# Patient Record
Sex: Female | Born: 1960
Health system: Southern US, Community
[De-identification: ages and names within clinical notes are randomized; demographics above are authoritative.]

## PROBLEM LIST (undated history)

## (undated) DIAGNOSIS — I779 Disorder of arteries and arterioles, unspecified: Secondary | ICD-10-CM

## (undated) DIAGNOSIS — I10 Essential (primary) hypertension: Secondary | ICD-10-CM

## (undated) DIAGNOSIS — Z87442 Personal history of urinary calculi: Secondary | ICD-10-CM

## (undated) DIAGNOSIS — U071 COVID-19: Secondary | ICD-10-CM

## (undated) DIAGNOSIS — G43909 Migraine, unspecified, not intractable, without status migrainosus: Secondary | ICD-10-CM

## (undated) DIAGNOSIS — R87619 Unspecified abnormal cytological findings in specimens from cervix uteri: Secondary | ICD-10-CM

## (undated) DIAGNOSIS — E785 Hyperlipidemia, unspecified: Secondary | ICD-10-CM

## (undated) HISTORY — PX: GYNECOLOGIC CRYOSURGERY: SHX857

## (undated) HISTORY — DX: COVID-19: U07.1

## (undated) HISTORY — DX: Disorder of arteries and arterioles, unspecified: I77.9

## (undated) HISTORY — DX: Unspecified abnormal cytological findings in specimens from cervix uteri: R87.619

## (undated) HISTORY — DX: Essential (primary) hypertension: I10

## (undated) HISTORY — DX: Migraine, unspecified, not intractable, without status migrainosus: G43.909

## (undated) HISTORY — PX: COLPOSCOPY: SHX161

## (undated) HISTORY — DX: Hyperlipidemia, unspecified: E78.5

## (undated) HISTORY — PX: TUBAL LIGATION: SHX77

---

## 1998-10-22 ENCOUNTER — Ambulatory Visit (HOSPITAL_COMMUNITY): Admission: RE | Admit: 1998-10-22 | Discharge: 1998-10-22 | Payer: Self-pay | Admitting: Gynecology

## 2000-12-21 ENCOUNTER — Other Ambulatory Visit: Admission: RE | Admit: 2000-12-21 | Discharge: 2000-12-21 | Payer: Self-pay | Admitting: Gynecology

## 2001-03-26 ENCOUNTER — Emergency Department (HOSPITAL_COMMUNITY): Admission: EM | Admit: 2001-03-26 | Discharge: 2001-03-26 | Payer: Self-pay | Admitting: Emergency Medicine

## 2002-04-20 ENCOUNTER — Other Ambulatory Visit: Admission: RE | Admit: 2002-04-20 | Discharge: 2002-04-20 | Payer: Self-pay | Admitting: Gynecology

## 2004-04-16 ENCOUNTER — Ambulatory Visit (HOSPITAL_COMMUNITY): Admission: RE | Admit: 2004-04-16 | Discharge: 2004-04-16 | Payer: Self-pay | Admitting: Plastic Surgery

## 2004-04-16 ENCOUNTER — Ambulatory Visit (HOSPITAL_BASED_OUTPATIENT_CLINIC_OR_DEPARTMENT_OTHER): Admission: RE | Admit: 2004-04-16 | Discharge: 2004-04-16 | Payer: Self-pay | Admitting: Plastic Surgery

## 2004-04-17 ENCOUNTER — Encounter (INDEPENDENT_AMBULATORY_CARE_PROVIDER_SITE_OTHER): Payer: Self-pay | Admitting: Specialist

## 2006-02-09 ENCOUNTER — Emergency Department (HOSPITAL_COMMUNITY): Admission: EM | Admit: 2006-02-09 | Discharge: 2006-02-09 | Payer: Self-pay | Admitting: Family Medicine

## 2006-04-07 ENCOUNTER — Encounter: Payer: Self-pay | Admitting: Gynecology

## 2007-11-10 ENCOUNTER — Other Ambulatory Visit: Admission: RE | Admit: 2007-11-10 | Discharge: 2007-11-10 | Payer: Self-pay | Admitting: Gynecology

## 2009-04-23 ENCOUNTER — Ambulatory Visit (HOSPITAL_COMMUNITY): Admission: RE | Admit: 2009-04-23 | Discharge: 2009-04-23 | Payer: Self-pay | Admitting: Gynecology

## 2009-07-17 ENCOUNTER — Emergency Department (HOSPITAL_COMMUNITY): Admission: EM | Admit: 2009-07-17 | Discharge: 2009-07-17 | Payer: Self-pay | Admitting: Family Medicine

## 2011-04-24 NOTE — Op Note (Signed)
NAME:  Melanie Howard, Melanie Howard                        ACCOUNT NO.:  [id number]   MEDICAL RECORD NO.:  [id number]                   PATIENT TYPE:  AMB   LOCATION:  DSC                                  FACILITY:  [facility]   PHYSICIAN:  Etter Sjogren, M.D.                  DATE OF BIRTH:  11-10-1961   DATE OF PROCEDURE:  04/16/2004  DATE OF DISCHARGE:                                 OPERATIVE REPORT   PREOPERATIVE DIAGNOSES:  1. Lesion of undetermined behavior greater than 1.0 cm of forehead.  2. Lesion of undetermined behavior less than 0.5 cm of the back times four.   POSTOPERATIVE DIAGNOSES:  1. Lesion of undetermined behavior greater than 1.0 cm of forehead.  2. Lesion of undetermined behavior less than 0.5 cm of the back times four.  3. Complicated open wound of the scalp 2.0 cm.  4. Complicated open wounds of the trunk of 4.5 cm.   OPERATION PERFORMED:  1. Excision of lesion of undetermined behavior of the scalp greater than 1.0     cm.  2. Excision of lesions of the trunk on the back less than 0.5 cm times four.  3. Complex wound closure of scalp 2.0 cm.  4. Wound closures of the trunk 4.5 cm.   SURGEON:  Etter Sjogren, M.D.   ANESTHESIA:  1% Xylocaine with epinephrine plus bicarb.   INDICATIONS FOR PROCEDURE:  This 50 year old woman had a lesion of her  scalp, has actinic keratosis, is in the hair.  This warrants a wider  excision.  We have talked about using Efudex cream but this might cause more  area of alopecia and she prefers the excision.  She understands the nature  of the procedure and the risks and possibility of further surgery depending  on the final pathology report and wishes to proceed.  In addition, she has  several pigmented lesions with irregular borders that have enlarged and  changed  on her trunk on her back and wanted excision as well.  She wished  to proceed.   DESCRIPTION OF PROCEDURE:  The patient was placed first supine.  The  elliptical excision was  designed around this area of actinic keratosis of  her scalp where she also has pigmented lesion within this as well.  She was  prepped with Betadine, draped with sterile drapes and satisfactory local  anesthesia was achieved.  The excision was performed, the wound irrigated  thoroughly and excellent hemostasis was confirmed.  A layered closure with 3-  0 Monocryl with interrupted inverted deep sutures, advancing the skin edges  together, then 5-0 Prolene simple interrupted sutures.  Antibiotic ointment  applied.  Patient then placed prone, prepped with Betadine and draped with  sterile drapes.  Satisfactory local anesthesia was achieved and all these  four lesions were excised and removed as specimens.  The wounds irrigated  thoroughly and again layered closure with 4-0 Monocryl interrupted inverted  deep  sutures and 4-0 Monocryl running subcuticular suture with an occasional 5-0  Prolene simple interrupted suture as needed.  3-0 Monocryl interrupted deep  sutures were used on the wound over the left lower back.  The patient  tolerated the procedure well.  Steri-Strips applied.  We will see her  back  in the office in about 10 days for recheck.                                               Etter Sjogren, M.D.    DB/MEDQ  D:  04/16/2004  T:  04/17/2004  Job:  [id number]

## 2013-12-04 ENCOUNTER — Other Ambulatory Visit: Payer: Self-pay | Admitting: Gynecology

## 2013-12-04 DIAGNOSIS — Z1231 Encounter for screening mammogram for malignant neoplasm of breast: Secondary | ICD-10-CM

## 2013-12-08 ENCOUNTER — Ambulatory Visit: Payer: Self-pay | Admitting: Gynecology

## 2013-12-19 ENCOUNTER — Ambulatory Visit (HOSPITAL_COMMUNITY): Payer: 59

## 2013-12-22 ENCOUNTER — Ambulatory Visit (HOSPITAL_COMMUNITY)
Admission: RE | Admit: 2013-12-22 | Discharge: 2013-12-22 | Disposition: A | Discharge status: Home / Self Care | Payer: 59 | Source: Ambulatory Visit | Attending: Gynecology | Admitting: Gynecology

## 2013-12-22 DIAGNOSIS — Z1231 Encounter for screening mammogram for malignant neoplasm of breast: Secondary | ICD-10-CM | POA: Insufficient documentation

## 2014-01-04 ENCOUNTER — Ambulatory Visit: Payer: Self-pay | Admitting: Gynecology

## 2014-01-25 ENCOUNTER — Ambulatory Visit: Payer: Self-pay | Admitting: Gynecology

## 2014-02-05 ENCOUNTER — Encounter: Payer: Self-pay | Admitting: Gynecology

## 2014-02-05 ENCOUNTER — Other Ambulatory Visit (HOSPITAL_COMMUNITY)
Admission: RE | Admit: 2014-02-05 | Discharge: 2014-02-05 | Disposition: A | Discharge status: Home / Self Care | Payer: 59 | Source: Ambulatory Visit | Attending: Gynecology | Admitting: Gynecology

## 2014-02-05 ENCOUNTER — Ambulatory Visit (INDEPENDENT_AMBULATORY_CARE_PROVIDER_SITE_OTHER): Payer: 59 | Admitting: Gynecology

## 2014-02-05 VITALS — BP 116/70 | Ht 62.0 in | Wt 129.0 lb

## 2014-02-05 DIAGNOSIS — N318 Other neuromuscular dysfunction of bladder: Secondary | ICD-10-CM

## 2014-02-05 DIAGNOSIS — Z01419 Encounter for gynecological examination (general) (routine) without abnormal findings: Secondary | ICD-10-CM | POA: Insufficient documentation

## 2014-02-05 DIAGNOSIS — Z1151 Encounter for screening for human papillomavirus (HPV): Secondary | ICD-10-CM | POA: Insufficient documentation

## 2014-02-05 DIAGNOSIS — N3281 Overactive bladder: Secondary | ICD-10-CM

## 2014-02-05 DIAGNOSIS — N926 Irregular menstruation, unspecified: Secondary | ICD-10-CM

## 2014-02-05 LAB — FOLLICLE STIMULATING HORMONE: FSH: 79.4 m[IU]/mL

## 2014-02-05 LAB — URINALYSIS W MICROSCOPIC + REFLEX CULTURE
BACTERIA UA: NONE SEEN
Bilirubin Urine: NEGATIVE
Casts: NONE SEEN
Crystals: NONE SEEN
Glucose, UA: NEGATIVE mg/dL
KETONES UR: NEGATIVE mg/dL
Leukocytes, UA: NEGATIVE
NITRITE: NEGATIVE
Protein, ur: NEGATIVE mg/dL
Specific Gravity, Urine: 1.012 (ref 1.005–1.030)
Squamous Epithelial / LPF: NONE SEEN
UROBILINOGEN UA: 0.2 mg/dL (ref 0.0–1.0)
pH: 5 (ref 5.0–8.0)

## 2014-02-05 LAB — CBC WITH DIFFERENTIAL/PLATELET
Basophils Absolute: 0.1 10*3/uL (ref 0.0–0.1)
Basophils Relative: 1 % (ref 0–1)
Eosinophils Absolute: 0.2 10*3/uL (ref 0.0–0.7)
Eosinophils Relative: 3 % (ref 0–5)
HCT: 40.4 % (ref 36.0–46.0)
Hemoglobin: 14.3 g/dL (ref 12.0–15.0)
Lymphocytes Relative: 32 % (ref 12–46)
Lymphs Abs: 1.9 10*3/uL (ref 0.7–4.0)
MCH: 30.9 pg (ref 26.0–34.0)
MCHC: 35.4 g/dL (ref 30.0–36.0)
MCV: 87.3 fL (ref 78.0–100.0)
Monocytes Absolute: 0.5 10*3/uL (ref 0.1–1.0)
Monocytes Relative: 9 % (ref 3–12)
Neutro Abs: 3.2 10*3/uL (ref 1.7–7.7)
Neutrophils Relative %: 55 % (ref 43–77)
Platelets: 227 10*3/uL (ref 150–400)
RBC: 4.63 MIL/uL (ref 3.87–5.11)
RDW: 12.9 % (ref 11.5–15.5)
WBC: 5.9 10*3/uL (ref 4.0–10.5)

## 2014-02-05 LAB — COMPREHENSIVE METABOLIC PANEL
ALT: 20 U/L (ref 0–35)
AST: 19 U/L (ref 0–37)
Albumin: 4.6 g/dL (ref 3.5–5.2)
Alkaline Phosphatase: 73 U/L (ref 39–117)
BUN: 15 mg/dL (ref 6–23)
CO2: 27 mEq/L (ref 19–32)
Calcium: 9.3 mg/dL (ref 8.4–10.5)
Chloride: 103 mEq/L (ref 96–112)
Creat: 0.71 mg/dL (ref 0.50–1.10)
Glucose, Bld: 71 mg/dL (ref 70–99)
Potassium: 4.1 mEq/L (ref 3.5–5.3)
Sodium: 142 mEq/L (ref 135–145)
Total Bilirubin: 0.6 mg/dL (ref 0.2–1.2)
Total Protein: 7.4 g/dL (ref 6.0–8.3)

## 2014-02-05 LAB — LIPID PANEL
Cholesterol: 183 mg/dL (ref 0–200)
HDL: 61 mg/dL (ref >39)
LDL Cholesterol: 106 mg/dL — ABNORMAL HIGH (ref 0–99)
Total CHOL/HDL Ratio: 3 Ratio
Triglycerides: 82 mg/dL (ref <150)
VLDL: 16 mg/dL (ref 0–40)

## 2014-02-05 LAB — TSH: TSH: 1.864 u[IU]/mL (ref 0.350–4.500)

## 2014-02-05 NOTE — Progress Notes (Signed)
Melanie Howard [birth year] [id number]        53 y.o.  G3P3 for annual exam.  Several years since her last exam. Several issues noted below.  Past medical history,surgical history, problem list, medications, allergies, family history and social history were all reviewed and documented in the EPIC chart.  ROS:  Performed and pertinent positives and negatives are included in the history, assessment and plan .  Exam: Kim assistant Filed Vitals:   02/05/14 0828  BP: 116/70  Height: 5\' 2"  (1.575 m)  Weight: 129 lb (58.514 kg)   General appearance  Normal Skin grossly normal Head/Neck normal with no cervical or supraclavicular adenopathy thyroid normal Lungs  clear Cardiac RR, without RMG Abdominal  soft, nontender, without masses, organomegaly or hernia Breasts  examined lying and sitting without masses, retractions, discharge or axillary adenopathy. Pelvic  Ext/BUS/vagina normal  Cervix normal with Pap/HPV done  Uterus anteverted, normal size, shape and contour, midline and mobile nontender   Adnexa  Without masses or tenderness    Anus and perineum  Normal   Rectovaginal  Normal sphincter tone without palpated masses or tenderness.    Assessment/Plan:  53 y.o. G3P3 female for annual exam irregular menses, tubal sterilization.   1. Patient knows of the past year or so her menses have become more irregular. Skips up to 6 months. Regular menses when they occur with no prolonged or atypical bleeding. Discussed the perimenopausal time with her. Patient will keep a menstrual calendar as long as regular less frequent menses and will monitor period of prolonged very typical bleeding then will followup. No significant hot flushes or night sweats. Discussed possible HRT if needed. Check baseline FSH TSH now. 2. Detrusor instability. Patient is having some urgency symptoms although admits to high caffeine intake. Also occasional loss of urine with sneezing laughing coughing. Options for  management to include behavior modification with more frequent voiding, avoiding caffeine/carbonated beverages. OAB medications reviewed with side effect profiles discussed. Surgery such as sling with urology referral also discussed. Patient may try OTC medication such as Oxytrol. Will call me if she wants prescription medication or referral to urology. 3. Pap smear 5 years ago. Pap/HPV today. History of cryosurgery and College with normal Pap smears repeated since then. 4. Mammography 12/2013. Continue with annual mammography. SBE monthly reviewed. 5. Colonoscopy never. Strongly recommended screening colonoscopy with names and numbers provided. Patient will call to arrange. 6. Health maintenance. Baseline CBC comprehensive metabolic panel lipid profile urinalysis ordered along with her TSH FSH and vitamin D level. Followup in one year, sooner if issues as outlined above.   Note: This document was prepared with digital dictation and possible smart phrase technology. Any transcriptional errors that result from this process are unintentional.   Anastasio Auerbach MD, 9:34 AM 02/05/2014

## 2014-02-05 NOTE — Patient Instructions (Addendum)
Schedule colonoscopy with [facility] at [phone] or [facility] at [phone] Tried over-the-counter overactive bladder medication. Call me if you want to pursue prescription medication or if you want referral to urology to consider bladder surgery. Followup in one year for annual exam.

## 2014-02-06 LAB — VITAMIN D 25 HYDROXY (VIT D DEFICIENCY, FRACTURES): Vit D, 25-Hydroxy: 29 ng/mL — ABNORMAL LOW (ref 30–89)

## 2014-10-08 ENCOUNTER — Encounter: Payer: Self-pay | Admitting: Gynecology

## 2016-08-12 ENCOUNTER — Other Ambulatory Visit (HOSPITAL_COMMUNITY): Payer: Self-pay | Admitting: Orthopedic Surgery

## 2016-08-12 DIAGNOSIS — M25562 Pain in left knee: Secondary | ICD-10-CM | POA: Diagnosis not present

## 2016-08-17 ENCOUNTER — Ambulatory Visit (HOSPITAL_COMMUNITY)
Admission: RE | Admit: 2016-08-17 | Discharge: 2016-08-17 | Disposition: A | Discharge status: Home / Self Care | Payer: 59 | Source: Ambulatory Visit | Attending: Orthopedic Surgery | Admitting: Orthopedic Surgery

## 2016-08-17 DIAGNOSIS — M25461 Effusion, right knee: Secondary | ICD-10-CM | POA: Insufficient documentation

## 2016-08-17 DIAGNOSIS — R600 Localized edema: Secondary | ICD-10-CM | POA: Diagnosis not present

## 2016-08-17 DIAGNOSIS — M25562 Pain in left knee: Secondary | ICD-10-CM | POA: Diagnosis not present

## 2016-08-17 DIAGNOSIS — M67864 Other specified disorders of tendon, left knee: Secondary | ICD-10-CM | POA: Insufficient documentation

## 2016-09-03 DIAGNOSIS — M25562 Pain in left knee: Secondary | ICD-10-CM | POA: Diagnosis not present

## 2016-10-12 MED FILL — PENICILLIN VK 500 MG TABLET: 500 | 7 days supply | Qty: 21 | Fill #0

## 2017-03-22 MED FILL — PENICILLIN VK 500 MG TABLET: 500 | 7 days supply | Qty: 21 | Fill #0

## 2017-03-22 MED FILL — CHLORHEXIDINE 0.12% RINSE: 0.12 | 15 days supply | Qty: 473 | Fill #0

## 2018-01-11 ENCOUNTER — Other Ambulatory Visit: Payer: Self-pay | Admitting: Gynecology

## 2018-01-11 DIAGNOSIS — Z1231 Encounter for screening mammogram for malignant neoplasm of breast: Secondary | ICD-10-CM

## 2018-01-13 DIAGNOSIS — J029 Acute pharyngitis, unspecified: Secondary | ICD-10-CM | POA: Diagnosis not present

## 2018-01-21 ENCOUNTER — Encounter: Payer: Self-pay | Admitting: Gynecology

## 2018-01-21 ENCOUNTER — Ambulatory Visit (INDEPENDENT_AMBULATORY_CARE_PROVIDER_SITE_OTHER): Payer: 59 | Admitting: Gynecology

## 2018-01-21 VITALS — BP 120/68 | Ht 62.0 in | Wt 130.0 lb

## 2018-01-21 DIAGNOSIS — Z01419 Encounter for gynecological examination (general) (routine) without abnormal findings: Secondary | ICD-10-CM | POA: Diagnosis not present

## 2018-01-21 DIAGNOSIS — Z1151 Encounter for screening for human papillomavirus (HPV): Secondary | ICD-10-CM | POA: Diagnosis not present

## 2018-01-21 DIAGNOSIS — Z1329 Encounter for screening for other suspected endocrine disorder: Secondary | ICD-10-CM

## 2018-01-21 DIAGNOSIS — N8189 Other female genital prolapse: Secondary | ICD-10-CM | POA: Diagnosis not present

## 2018-01-21 DIAGNOSIS — R32 Unspecified urinary incontinence: Secondary | ICD-10-CM

## 2018-01-21 DIAGNOSIS — Z1321 Encounter for screening for nutritional disorder: Secondary | ICD-10-CM

## 2018-01-21 DIAGNOSIS — N951 Menopausal and female climacteric states: Secondary | ICD-10-CM | POA: Diagnosis not present

## 2018-01-21 DIAGNOSIS — R87618 Other abnormal cytological findings on specimens from cervix uteri: Secondary | ICD-10-CM | POA: Diagnosis not present

## 2018-01-21 DIAGNOSIS — Z13220 Encounter for screening for lipoid disorders: Secondary | ICD-10-CM

## 2018-01-21 NOTE — Addendum Note (Signed)
Addended by: Nelva Nay on: 01/21/2018 10:36 AM   Modules accepted: Orders

## 2018-01-21 NOTE — Patient Instructions (Signed)
Office will call to arrange the urology appointment.  Schedule a screening mammogram  Schedule a screening colonoscopy.

## 2018-01-21 NOTE — Progress Notes (Signed)
Melanie Howard Jan 30, 1961 [id number]        57 y.o.  G3P3 for annual gynecologic exam.  Has not been seen in the office for over 3 years.  Patient is complaining of worsening urinary symptoms to include loss of urine with coughing sneezing laughing.  Also having worsening urgency symptoms where she will have to run to the bathroom and cannot make it sometimes with incontinence.  No dysuria frequency low back pain fever or chills.  No vaginal complaints such as discharge or irritation.  She does note bulging from the vagina particularly after being on her feet for long periods of time that she physically pushes things back.  All this is been going getting worse over the past year.  Has been without menses over several years.  Is having some night sweats and mild hot flushes.  No issues with vaginal dryness or dyspareunia.  Past medical history,surgical history, problem list, medications, allergies, family history and social history were all reviewed and documented as reviewed in the EPIC chart.  ROS:  Performed with pertinent positives and negatives included in the history, assessment and plan.   Additional significant findings : None   Exam: Caryn Bee assistant Vitals:   01/21/18 0859  BP: 120/68  Weight: 130 lb (59 kg)  Height: 5\' 2"  (1.575 m)   Body mass index is 23.78 kg/m.  General appearance:  Normal affect, orientation and appearance. Skin: Grossly normal HEENT: Without gross lesions.  No cervical or supraclavicular adenopathy. Thyroid normal.  Lungs:  Clear without wheezing, rales or rhonchi Cardiac: RR, without RMG Abdominal:  Soft, nontender, without masses, guarding, rebound, organomegaly or hernia Breasts:  Examined lying and sitting without masses, retractions, discharge or axillary adenopathy. Pelvic:  Ext, BUS, Vagina: With second-degree cystocele with straining and first-degree uterine prolapse.  No significant rectocele  Cervix: Normal.  Pap  smear/HPV  Uterus: Anteverted, normal size, shape and contour, midline and mobile nontender   Adnexa: Without masses or tenderness    Anus and perineum: Normal   Rectovaginal: Normal sphincter tone without palpated masses or tenderness.    Assessment/Plan:  57 y.o. G3P3 female for annual gynecologic exam without menses, tubal sterilization.   1. Urinary incontinence, mixed pattern with both urge and stress symptoms.  Check baseline urine analysis now.  Also with second-degree cystocele and mild uterine prolapse.  Discussed the differences between stress incontinence and urge incontinence.  Medication use with urge versus surgical correction and Kegel with stress discussed.  The issue of cystocele and how this is contributing to her symptoms discussed.  Certainly accounts for the intrusion from the vagina with straining or being on her feet.  Uterus does show mild descent.  Recommended referral to urology to consider surgical repair of cystocele/sling.  If proceeding with this possible hysterectomy for her uterine prolapse also reviewed.  At this point I do not think she is overly symptomatic from this although it is possible after being on her feet all day this has a more exaggerated prolapse than appreciable today.  Regardless the issues of whether this would be an issue later and should be taken care of now if she is having surgery for the cystocele discussed.  Patient agrees with the plan and will make that referral for her. 2. Mammography 12/2013.  Reminded patient she is overdue and recommended she schedule a screening mammogram.  Breast exam normal today. 3. Colonoscopy never.  We discussed colon cancer as the second most common cancer in women  and the need to schedule a screening colonoscopy and she acknowledges my recommendation. 4. Pap smear/HPV 2015.  Pap smear/HPV today.  History of cryosurgery in college with subsequent Pap smears normal. 5. Health maintenance.  Patient requests baseline  labs.  CBC, CMP, lipid profile, TSH, vitamin D, urine analysis ordered.  Follow-up with urology as arranged.  Follow-up for annual exam in 1 year.  Additional time in excess of her routine gynecologic exam was spent in direct face to face counseling and coordination of care in regards to her urinary incontinence and pelvic relaxation.    Anastasio Auerbach MD, 10:03 AM 01/21/2018

## 2018-01-22 LAB — COMPREHENSIVE METABOLIC PANEL
AG Ratio: 1.8 (calc) (ref 1.0–2.5)
ALKALINE PHOSPHATASE (APISO): 78 U/L (ref 33–130)
ALT: 21 U/L (ref 6–29)
AST: 21 U/L (ref 10–35)
Albumin: 4.4 g/dL (ref 3.6–5.1)
BILIRUBIN TOTAL: 0.6 mg/dL (ref 0.2–1.2)
BUN: 12 mg/dL (ref 7–25)
CALCIUM: 9.5 mg/dL (ref 8.6–10.4)
CO2: 29 mmol/L (ref 20–32)
Chloride: 104 mmol/L (ref 98–110)
Creat: 0.78 mg/dL (ref 0.50–1.05)
Globulin: 2.4 g/dL (calc) (ref 1.9–3.7)
Glucose, Bld: 77 mg/dL (ref 65–99)
Potassium: 4.1 mmol/L (ref 3.5–5.3)
Sodium: 140 mmol/L (ref 135–146)
Total Protein: 6.8 g/dL (ref 6.1–8.1)

## 2018-01-22 LAB — CBC WITH DIFFERENTIAL/PLATELET
BASOS ABS: 33 {cells}/uL (ref 0–200)
Basophils Relative: 0.5 %
Eosinophils Absolute: 111 cells/uL (ref 15–500)
Eosinophils Relative: 1.7 %
HEMATOCRIT: 40.8 % (ref 35.0–45.0)
Hemoglobin: 14 g/dL (ref 11.7–15.5)
LYMPHS ABS: 2256 {cells}/uL (ref 850–3900)
MCH: 30.5 pg (ref 27.0–33.0)
MCHC: 34.3 g/dL (ref 32.0–36.0)
MCV: 88.9 fL (ref 80.0–100.0)
MPV: 9.2 fL (ref 7.5–12.5)
Monocytes Relative: 8.1 %
NEUTROS PCT: 55 %
Neutro Abs: 3575 cells/uL (ref 1500–7800)
Platelets: 266 10*3/uL (ref 140–400)
RBC: 4.59 10*6/uL (ref 3.80–5.10)
RDW: 12.6 % (ref 11.0–15.0)
Total Lymphocyte: 34.7 %
WBC mixed population: 527 cells/uL (ref 200–950)
WBC: 6.5 10*3/uL (ref 3.8–10.8)

## 2018-01-22 LAB — LIPID PANEL
CHOLESTEROL: 193 mg/dL (ref <200)
HDL: 53 mg/dL (ref >50)
LDL CHOLESTEROL (CALC): 117 mg/dL — AB
Non-HDL Cholesterol (Calc): 140 mg/dL (calc) — ABNORMAL HIGH (ref <130)
TRIGLYCERIDES: 124 mg/dL (ref <150)
Total CHOL/HDL Ratio: 3.6 (calc) (ref <5.0)

## 2018-01-22 LAB — TSH: TSH: 2.09 mIU/L (ref 0.40–4.50)

## 2018-01-22 LAB — VITAMIN D 25 HYDROXY (VIT D DEFICIENCY, FRACTURES): VIT D 25 HYDROXY: 23 ng/mL — AB (ref 30–100)

## 2018-01-23 LAB — URINALYSIS, COMPLETE W/RFL CULTURE
BACTERIA UA: NONE SEEN /HPF
Bilirubin Urine: NEGATIVE
GLUCOSE, UA: NEGATIVE
HGB URINE DIPSTICK: NEGATIVE
HYALINE CAST: NONE SEEN /LPF
KETONES UR: NEGATIVE
Leukocyte Esterase: NEGATIVE
Nitrites, Initial: NEGATIVE
PROTEIN: NEGATIVE
RBC / HPF: NONE SEEN /HPF (ref 0–2)
Specific Gravity, Urine: 1.003 (ref 1.001–1.03)
WBC, UA: NONE SEEN /HPF (ref 0–5)
pH: 7 (ref 5.0–8.0)

## 2018-01-23 LAB — URINE CULTURE
MICRO NUMBER:: 90205306
SPECIMEN QUALITY:: ADEQUATE

## 2018-01-23 LAB — NO CULTURE INDICATED

## 2018-01-25 ENCOUNTER — Telehealth: Payer: Self-pay | Admitting: *Deleted

## 2018-01-25 ENCOUNTER — Other Ambulatory Visit: Payer: Self-pay | Admitting: *Deleted

## 2018-01-25 DIAGNOSIS — E559 Vitamin D deficiency, unspecified: Secondary | ICD-10-CM

## 2018-01-25 NOTE — Telephone Encounter (Signed)
-----   Message from Anastasio Auerbach, MD sent at 01/21/2018 10:24 AM EST ----- Arrange urology appointment with Dr. Bernerd Limbo reference urinary incontinence, cystocele/mild uterine prolapse.

## 2018-01-25 NOTE — Telephone Encounter (Signed)
[facility] to [facility] will call pt to schedule.

## 2018-01-27 LAB — PAP IG AND HPV HIGH-RISK: HPV DNA High Risk: NOT DETECTED

## 2018-01-28 ENCOUNTER — Ambulatory Visit
Admission: RE | Admit: 2018-01-28 | Discharge: 2018-01-28 | Disposition: A | Discharge status: Home / Self Care | Payer: 59 | Source: Ambulatory Visit | Attending: Gynecology | Admitting: Gynecology

## 2018-01-28 DIAGNOSIS — Z1231 Encounter for screening mammogram for malignant neoplasm of breast: Secondary | ICD-10-CM

## 2018-01-31 ENCOUNTER — Other Ambulatory Visit: Payer: Self-pay | Admitting: Gynecology

## 2018-01-31 DIAGNOSIS — R928 Other abnormal and inconclusive findings on diagnostic imaging of breast: Secondary | ICD-10-CM

## 2018-02-01 ENCOUNTER — Ambulatory Visit
Admission: RE | Admit: 2018-02-01 | Discharge: 2018-02-01 | Disposition: A | Discharge status: Home / Self Care | Payer: 59 | Source: Ambulatory Visit | Attending: Gynecology | Admitting: Gynecology

## 2018-02-01 DIAGNOSIS — R928 Other abnormal and inconclusive findings on diagnostic imaging of breast: Secondary | ICD-10-CM

## 2018-02-01 DIAGNOSIS — R921 Mammographic calcification found on diagnostic imaging of breast: Secondary | ICD-10-CM | POA: Diagnosis not present

## 2018-02-11 DIAGNOSIS — R351 Nocturia: Secondary | ICD-10-CM | POA: Diagnosis not present

## 2018-02-11 DIAGNOSIS — R350 Frequency of micturition: Secondary | ICD-10-CM | POA: Diagnosis not present

## 2018-02-11 DIAGNOSIS — N3946 Mixed incontinence: Secondary | ICD-10-CM | POA: Diagnosis not present

## 2018-02-11 NOTE — Telephone Encounter (Signed)
Pt scheduled on 02/11/18 @ 8:30am with Dr.MacDiarmid

## 2018-02-21 ENCOUNTER — Telehealth: Payer: Self-pay | Admitting: *Deleted

## 2018-02-21 NOTE — Telephone Encounter (Signed)
Pt informed.  Will schedule visit if she plans to proceed.

## 2018-02-21 NOTE — Telephone Encounter (Signed)
-----   Message from Anastasio Auerbach, MD sent at 02/18/2018  4:00 PM EDT ----- Tell patient I received the report from Dr. Matilde Sprang.  If she is planning for him to do surgery then we can discuss the issue about hysterectomy.

## 2018-02-24 DIAGNOSIS — R351 Nocturia: Secondary | ICD-10-CM | POA: Diagnosis not present

## 2018-02-24 DIAGNOSIS — R350 Frequency of micturition: Secondary | ICD-10-CM | POA: Diagnosis not present

## 2018-02-24 DIAGNOSIS — N3946 Mixed incontinence: Secondary | ICD-10-CM | POA: Diagnosis not present

## 2018-03-14 MED FILL — AZITHROMYCIN 250 MG TABLET: 250 | 1 days supply | Qty: 2 | Fill #0

## 2018-03-14 MED FILL — CHLORHEXIDINE 0.12% RINSE: 0.12 | 16 days supply | Qty: 473 | Fill #0

## 2019-05-25 ENCOUNTER — Emergency Department (HOSPITAL_COMMUNITY)
Admission: EM | Admit: 2019-05-25 | Discharge: 2019-05-26 | Disposition: A | Discharge status: Home / Self Care | Payer: 59 | Attending: Emergency Medicine | Admitting: Emergency Medicine

## 2019-05-25 ENCOUNTER — Emergency Department (HOSPITAL_COMMUNITY): Payer: 59

## 2019-05-25 ENCOUNTER — Other Ambulatory Visit: Payer: Self-pay

## 2019-05-25 ENCOUNTER — Encounter (HOSPITAL_COMMUNITY): Payer: Self-pay | Admitting: Emergency Medicine

## 2019-05-25 DIAGNOSIS — R0789 Other chest pain: Secondary | ICD-10-CM | POA: Insufficient documentation

## 2019-05-25 DIAGNOSIS — B349 Viral infection, unspecified: Secondary | ICD-10-CM | POA: Insufficient documentation

## 2019-05-25 DIAGNOSIS — R630 Anorexia: Secondary | ICD-10-CM | POA: Diagnosis not present

## 2019-05-25 DIAGNOSIS — R05 Cough: Secondary | ICD-10-CM | POA: Diagnosis not present

## 2019-05-25 DIAGNOSIS — Z20828 Contact with and (suspected) exposure to other viral communicable diseases: Secondary | ICD-10-CM | POA: Diagnosis not present

## 2019-05-25 DIAGNOSIS — R509 Fever, unspecified: Secondary | ICD-10-CM | POA: Diagnosis present

## 2019-05-25 DIAGNOSIS — R531 Weakness: Secondary | ICD-10-CM | POA: Insufficient documentation

## 2019-05-25 DIAGNOSIS — M791 Myalgia, unspecified site: Secondary | ICD-10-CM | POA: Insufficient documentation

## 2019-05-25 MED ORDER — ACETAMINOPHEN 325 MG PO TABS
650.0000 mg | ORAL_TABLET | Freq: Once | ORAL | Status: AC
  Administered 2019-05-25: 650 mg via ORAL
  Filled 2019-05-25: qty 2

## 2019-05-25 NOTE — ED Provider Notes (Signed)
[facility] EMERGENCY DEPARTMENT Provider Note   CSN: [id number] Arrival date & time: 05/25/19  2104    History   Chief Complaint Chief Complaint  Patient presents with  . Fever    HPI Melanie Howard is a 58 y.o. female presents to emergency department today with chief complaint of fever x3 days.  T-max at home was 101.9 yesterday.  She has been taking Tylenol every 4 hours which helps the fever initially however it returns.  She is also reporting associated body aches and generalized weakness.  She has a nonproductive cough.  She states after coughing multiple times she will have chest tightness.  She has decreased appetite with minimal p.o. intake since symptom onset. She noticed today her throat was sore when swallowing. 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  She reports she had a negative COVID test yesterday. She denies any congestion, shortness of breath, abdominal pain, urinary symptoms, diarrhea.  History reviewed. No pertinent past medical history.  There are no active problems to display for this patient.   Past Surgical History:  Procedure Laterality Date  . COLPOSCOPY    . GYNECOLOGIC CRYOSURGERY  college   paps normal since  . TUBAL LIGATION       OB History    Gravida  3   Para  3   Term      Preterm      AB      Living  3     SAB      TAB      Ectopic      Multiple      Live Births               Home Medications    Prior to Admission medications   Medication Sig Start Date End Date Taking? Authorizing Provider  Ibuprofen (ADVIL PO) Take by mouth.    [provider]    Family History Family History  Problem Relation Age of Onset  . Hypertension Mother   . Hypertension Father   . Heart disease Father   . Breast cancer Maternal Grandmother        Age unknown    Social History Social History   Tobacco Use  . Smoking status: Never Smoker  . Smokeless tobacco: Never Used   Substance Use Topics  . Alcohol use: Yes    Comment: Rare  . Drug use: No     Allergies   Patient has no known allergies.   Review of Systems Review of Systems  Constitutional: Positive for appetite change and fever. Negative for chills.  HENT: Positive for sore throat. Negative for congestion, ear discharge, ear pain, sinus pressure and sinus pain.   Eyes: Negative for pain and redness.  Respiratory: Positive for cough and chest tightness. Negative for shortness of breath.   Cardiovascular: Negative for chest pain.  Gastrointestinal: Negative for abdominal pain, constipation, diarrhea, nausea and vomiting.  Genitourinary: Negative for dysuria and hematuria.  Musculoskeletal: Negative for back pain and neck pain.  Skin: Negative for wound.  Neurological: Negative for weakness, numbness and headaches.     Physical Exam Updated Vital Signs BP 133/80 (BP Location: Left Arm)   Pulse 96   Temp 99.3 F (37.4 C) (Oral)   Resp 18   Ht 5\' 2"  (1.575 m)   Wt 59 kg   LMP 01/08/2014   SpO2 96%   BMI 23.78 kg/m   Physical Exam  Vitals signs and nursing note reviewed.  Constitutional:      General: She is not in acute distress.    Appearance: She is not ill-appearing.  HENT:     Head: Normocephalic and atraumatic.     Comments: No sinus or temporal tenderness.    Right Ear: Tympanic membrane and external ear normal.     Left Ear: Tympanic membrane and external ear normal.     Nose: Nose normal.     Mouth/Throat:     Mouth: Mucous membranes are moist.     Pharynx: Oropharynx is clear.     Comments: Minor erythema to oropharynx, no edema, no exudate, no tonsillar swelling, voice normal, neck supple without lymphadenopathy  Eyes:     General: No scleral icterus.       Right eye: No discharge.        Left eye: No discharge.     Extraocular Movements: Extraocular movements intact.     Conjunctiva/sclera: Conjunctivae normal.     Pupils: Pupils are equal, round, and reactive  to light.  Neck:     Musculoskeletal: Normal range of motion.     Vascular: No JVD.  Cardiovascular:     Rate and Rhythm: Normal rate and regular rhythm.     Pulses: Normal pulses.          Radial pulses are 2+ on the right side and 2+ on the left side.     Heart sounds: Normal heart sounds.  Pulmonary:     Comments: Lungs clear to auscultation in all fields. Symmetric chest rise. No wheezing, rales, or rhonchi. Abdominal:     Comments: Abdomen is soft, non-distended, and non-tender in all quadrants. No rigidity, no guarding. No peritoneal signs.  Musculoskeletal: Normal range of motion.  Skin:    General: Skin is warm and dry.     Capillary Refill: Capillary refill takes less than 2 seconds.  Neurological:     Mental Status: She is oriented to person, place, and time.     GCS: GCS eye subscore is 4. GCS verbal subscore is 5. GCS motor subscore is 6.     Comments: Fluent speech, no facial droop.  Psychiatric:        Behavior: Behavior normal.      ED Treatments / Results  Labs (all labs ordered are listed, but only abnormal results are displayed) Labs Reviewed  CBC WITH DIFFERENTIAL/PLATELET - Abnormal; Notable for the following components:      Result Value   WBC 3.7 (*)    Platelets 146 (*)    All other components within normal limits  BASIC METABOLIC PANEL - Abnormal; Notable for the following components:   Glucose, Bld 118 (*)    Calcium 8.8 (*)    All other components within normal limits  GROUP A STREP BY PCR  CULTURE, BLOOD (ROUTINE X 2)  CULTURE, BLOOD (ROUTINE X 2)  SARS CORONAVIRUS 2 (HOSPITAL ORDER, [facility])  LACTIC ACID, PLASMA  URINALYSIS, ROUTINE W REFLEX MICROSCOPIC  LACTIC ACID, PLASMA    EKG None  Radiology Dg Chest Portable 1 View  Result Date: 05/25/2019 CLINICAL DATA:  Cough and fever EXAM: PORTABLE CHEST 1 VIEW COMPARISON:  None. FINDINGS: The heart size and mediastinal contours are within normal limits. Both  lungs are clear. The visualized skeletal structures are unremarkable. IMPRESSION: No active disease. Electronically Signed   By: Donavan Foil M.D.   On: 05/25/2019 23:54    Procedures Procedures (including  critical care time)  Medications Ordered in ED Medications  acetaminophen (TYLENOL) tablet 650 mg (650 mg Oral Given 05/25/19 2338)     Initial Impression / Assessment and Plan / ED Course  I have reviewed the triage vital signs and the nursing notes.  Pertinent labs & imaging results that were available during my care of the patient were reviewed by me and considered in my medical decision making (see chart for details).  Pt is febrile to 99.3 otherwise VSS. Lung sounds are clear throughout. Minor erythema to posterior oropharynx on exam. She is non toxic appearing, in no acute distress. DDx includes pneumonia, covid, strep, uti. Work up initiated with CBC, BMP, strep pcr, UA, chest xray. Po tylenol for fever.  Chest xray viewed by me is without infiltrate, pneumonia unlikely. CBC with leukopenia 3.7, mild thrombocytopenia at 146. Strep test is negative. Discussed with attending Dr. Wyvonnia Dusky and will add send out covid swab, lactic acid, and blood cultures. Patient care transferred to attending at the end of my shift. Patient presentation, ED course, and plan of care discussed with review of all pertinent labs and imaging. Please see his note for further details regarding further ED course and disposition.  ZENITH KERCHEVAL was evaluated in Emergency Department on 05/26/2019 for the symptoms described in the history of present illness. She was evaluated in the context of the global COVID-19 pandemic, which necessitated consideration that the patient might be at risk for infection with the SARS-CoV-2 virus that causes COVID-19. Institutional protocols and algorithms that pertain to the evaluation of patients at risk for COVID-19 are in a state of rapid change based on information released by  regulatory bodies including the CDC and federal and state organizations. These policies and algorithms were followed during the patient's care in the ED.  This note was prepared using Dragon voice recognition software and may include unintentional dictation errors due to the inherent limitations of voice recognition software.   Final Clinical Impressions(s) / ED Diagnoses   Final diagnoses:  None    ED Discharge Orders    None       Flint Melter 05/26/19 0108    Ezequiel Essex, MD 05/26/19 [phone]

## 2019-05-25 NOTE — ED Triage Notes (Signed)
Pt states she has been running a fever off and on since Tuesday. 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 Pt herself had a - Covid screening across the street from our hospital. Tmaxx at home was 101.9. Last antipyretic taken was early this am.

## 2019-05-26 DIAGNOSIS — R0789 Other chest pain: Secondary | ICD-10-CM | POA: Diagnosis not present

## 2019-05-26 DIAGNOSIS — B349 Viral infection, unspecified: Secondary | ICD-10-CM | POA: Diagnosis not present

## 2019-05-26 DIAGNOSIS — R531 Weakness: Secondary | ICD-10-CM | POA: Diagnosis not present

## 2019-05-26 DIAGNOSIS — Z20828 Contact with and (suspected) exposure to other viral communicable diseases: Secondary | ICD-10-CM | POA: Diagnosis not present

## 2019-05-26 DIAGNOSIS — R630 Anorexia: Secondary | ICD-10-CM | POA: Diagnosis not present

## 2019-05-26 DIAGNOSIS — M791 Myalgia, unspecified site: Secondary | ICD-10-CM | POA: Diagnosis not present

## 2019-05-26 LAB — BASIC METABOLIC PANEL
Anion gap: 11 (ref 5–15)
BUN: 10 mg/dL (ref 6–20)
CO2: 26 mmol/L (ref 22–32)
Calcium: 8.8 mg/dL — ABNORMAL LOW (ref 8.9–10.3)
Chloride: 102 mmol/L (ref 98–111)
Creatinine, Ser: 0.84 mg/dL (ref 0.44–1.00)
GFR calc Af Amer: >60 mL/min (ref >60)
GFR calc non Af Amer: >60 mL/min (ref >60)
Glucose, Bld: 118 mg/dL — ABNORMAL HIGH (ref 70–99)
Potassium: 4.1 mmol/L (ref 3.5–5.1)
Sodium: 139 mmol/L (ref 135–145)

## 2019-05-26 LAB — CBC WITH DIFFERENTIAL/PLATELET
Abs Immature Granulocytes: 0.01 10*3/uL (ref 0.00–0.07)
Basophils Absolute: 0 10*3/uL (ref 0.0–0.1)
Basophils Relative: 1 %
Eosinophils Absolute: 0 10*3/uL (ref 0.0–0.5)
Eosinophils Relative: 0 %
HCT: 42.5 % (ref 36.0–46.0)
Hemoglobin: 14.5 g/dL (ref 12.0–15.0)
Immature Granulocytes: 0 %
Lymphocytes Relative: 31 %
Lymphs Abs: 1.2 10*3/uL (ref 0.7–4.0)
MCH: 30.7 pg (ref 26.0–34.0)
MCHC: 34.1 g/dL (ref 30.0–36.0)
MCV: 90 fL (ref 80.0–100.0)
Monocytes Absolute: 0.6 10*3/uL (ref 0.1–1.0)
Monocytes Relative: 16 %
Neutro Abs: 2 10*3/uL (ref 1.7–7.7)
Neutrophils Relative %: 52 %
Platelets: 146 10*3/uL — ABNORMAL LOW (ref 150–400)
RBC: 4.72 MIL/uL (ref 3.87–5.11)
RDW: 12 % (ref 11.5–15.5)
WBC: 3.7 10*3/uL — ABNORMAL LOW (ref 4.0–10.5)
nRBC: 0 % (ref 0.0–0.2)

## 2019-05-26 LAB — URINALYSIS, ROUTINE W REFLEX MICROSCOPIC
Bacteria, UA: NONE SEEN
Bilirubin Urine: NEGATIVE
Glucose, UA: NEGATIVE mg/dL
Ketones, ur: NEGATIVE mg/dL
Leukocytes,Ua: NEGATIVE
Nitrite: NEGATIVE
Protein, ur: 30 mg/dL — AB
Specific Gravity, Urine: 1.026 (ref 1.005–1.030)
pH: 5 (ref 5.0–8.0)

## 2019-05-26 LAB — TROPONIN I: Troponin I: <0.03 ng/mL (ref <0.03)

## 2019-05-26 LAB — GROUP A STREP BY PCR: Group A Strep by PCR: NOT DETECTED

## 2019-05-26 LAB — SARS CORONAVIRUS 2 BY RT PCR (HOSPITAL ORDER, PERFORMED IN ~~LOC~~ HOSPITAL LAB): SARS Coronavirus 2: NEGATIVE

## 2019-05-26 LAB — LACTIC ACID, PLASMA: Lactic Acid, Venous: 1.2 mmol/L (ref 0.5–1.9)

## 2019-05-26 MED ORDER — DOXYCYCLINE HYCLATE 100 MG PO CAPS: 100.0000 mg | ORAL_CAPSULE | Freq: Two times a day (BID) | ORAL | 0 refills | Status: DC

## 2019-05-26 MED FILL — DOXYCYCLINE HYC 100 MG CAPS: 100 | 10 days supply | Qty: 20 | Fill #0

## 2019-05-26 NOTE — ED Provider Notes (Signed)
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  She has a fever for the past 3 days with body aches, chills, generalized weakness, nonproductive cough.  Some chest tightness with coughing.  Negative COVID test last week.   Patient appears nontoxic, no meningismus, clear lungs, soft abdomen.  Work-up was reassuring with negative chest x-ray, coronavirus testing, urinalysis.  Does have some leukopenia.  Discussed with patient she may have viral illness possibly coronavirus despite negative test.  Will advise home quarantine.  Patient also concerned about possible tick bites will check [place] and [facility] spotted fever titers. Empiric doxycycline started.  Patient to follow-up with her PCP as well as follow-up for her blood test on MyChart.  Return precautions discussed.   EKG Interpretation  Date/Time:  Friday May 26 2019 03:02:59 EDT Ventricular Rate:  65 PR Interval:    QRS Duration: 92 QT Interval:  408 QTC Calculation: 425 R Axis:   76 Text Interpretation:  Sinus rhythm No previous ECGs available Confirmed by Ezequiel Essex [phone]) on 05/26/2019 3:19:27 AM         Ezequiel Essex, MD 05/26/19 0430

## 2019-05-26 NOTE — Discharge Instructions (Signed)
Your coronavirus test is negative.  Your chest x-ray is negative.  It is still possible however that she could have coronavirus despite negative test and you should home quarantine.  Your treated empirically for possible [facility] spotted fever and your test results will be available online.  Return to the ED with new or worsening symptoms.     Person Under Monitoring Name: Melanie Howard  Location: [place] [address]   Infection Prevention Recommendations for Individuals Confirmed to have, or Being Evaluated for, 2019 Novel Coronavirus (COVID-19) Infection Who Receive Care at Home  Individuals who are confirmed to have, or are being evaluated for, COVID-19 should follow the prevention steps below until a healthcare provider or local or state health department says they can return to normal activities.  Stay home except to get medical care You should restrict activities outside your home, except for getting medical care. Do not go to work, school, or public areas, and do not use public transportation or taxis.  Call ahead before visiting your doctor Before your medical appointment, call the healthcare provider and tell them that you have, or are being evaluated for, COVID-19 infection. This will help the healthcare providers office take steps to keep other people from getting infected. Ask your healthcare provider to call the local or state health department.  Monitor your symptoms Seek prompt medical attention if your illness is worsening (e.g., difficulty breathing). Before going to your medical appointment, call the healthcare provider and tell them that you have, or are being evaluated for, COVID-19 infection. Ask your healthcare provider to call the local or state health department.  Wear a facemask You should wear a facemask that covers your nose and mouth when you are in the same room with other people and when you visit a healthcare provider.  People who live with or visit you should also wear a facemask while they are in the same room with you.  Separate yourself from other people in your home As much as possible, you should stay in a different room from other people in your home. Also, you should use a separate bathroom, if available.  Avoid sharing household items You should not share dishes, drinking glasses, cups, eating utensils, towels, bedding, or other items with other people in your home. After using these items, you should wash them thoroughly with soap and water.  Cover your coughs and sneezes Cover your mouth and nose with a tissue when you cough or sneeze, or you can cough or sneeze into your sleeve. Throw used tissues in a lined trash can, and immediately wash your hands with soap and water for at least 20 seconds or use an alcohol-based hand rub.  Wash your [facility] your hands often and thoroughly with soap and water for at least 20 seconds. You can use an alcohol-based hand sanitizer if soap and water are not available and if your hands are not visibly dirty. Avoid touching your eyes, nose, and mouth with unwashed hands.   Prevention Steps for Caregivers and Household Members of Individuals Confirmed to have, or Being Evaluated for, COVID-19 Infection Being Cared for in the Home  If you live with, or provide care at home for, a person confirmed to have, or being evaluated for, COVID-19 infection please follow these guidelines to prevent infection:  Follow healthcare providers instructions Make sure that you understand and can help the patient follow any healthcare provider instructions for all care.  Provide for the patients  basic needs You should help the patient with basic needs in the home and provide support for getting groceries, prescriptions, and other personal needs.  Monitor the patients symptoms If they are getting sicker, call his or her medical provider and tell them that the patient  has, or is being evaluated for, COVID-19 infection. This will help the healthcare providers office take steps to keep other people from getting infected. Ask the healthcare provider to call the local or state health department.  Limit the number of people who have contact with the patient If possible, have only one caregiver for the patient. Other household members should stay in another home or place of residence. If this is not possible, they should stay in another room, or be separated from the patient as much as possible. Use a separate bathroom, if available. Restrict visitors who do not have an essential need to be in the home.  Keep older adults, very young children, and other sick people away from the patient Keep older adults, very young children, and those who have compromised immune systems or chronic health conditions away from the patient. This includes people with chronic heart, lung, or kidney conditions, diabetes, and cancer.  Ensure good ventilation Make sure that shared spaces in the home have good air flow, such as from an air conditioner or an opened window, weather permitting.  Wash your hands often Wash your hands often and thoroughly with soap and water for at least 20 seconds. You can use an alcohol based hand sanitizer if soap and water are not available and if your hands are not visibly dirty. Avoid touching your eyes, nose, and mouth with unwashed hands. Use disposable paper towels to dry your hands. If not available, use dedicated cloth towels and replace them when they become wet.  Wear a facemask and gloves Wear a disposable facemask at all times in the room and gloves when you touch or have contact with the patients blood, body fluids, and/or secretions or excretions, such as sweat, saliva, sputum, nasal mucus, vomit, urine, or feces.  Ensure the mask fits over your nose and mouth tightly, and do not touch it during use. Throw out disposable facemasks and  gloves after using them. Do not reuse. Wash your hands immediately after removing your facemask and gloves. If your personal clothing becomes contaminated, carefully remove clothing and launder. Wash your hands after handling contaminated clothing. Place all used disposable facemasks, gloves, and other waste in a lined container before disposing them with other household waste. Remove gloves and wash your hands immediately after handling these items.  Do not share dishes, glasses, or other household items with the patient Avoid sharing household items. You should not share dishes, drinking glasses, cups, eating utensils, towels, bedding, or other items with a patient who is confirmed to have, or being evaluated for, COVID-19 infection. After the person uses these items, you should wash them thoroughly with soap and water.  Wash laundry thoroughly Immediately remove and wash clothes or bedding that have blood, body fluids, and/or secretions or excretions, such as sweat, saliva, sputum, nasal mucus, vomit, urine, or feces, on them. Wear gloves when handling laundry from the patient. Read and follow directions on labels of laundry or clothing items and detergent. In general, wash and dry with the warmest temperatures recommended on the label.  Clean all areas the individual has used often Clean all touchable surfaces, such as counters, tabletops, doorknobs, bathroom fixtures, toilets, phones, keyboards, tablets, and bedside tables, every  day. Also, clean any surfaces that may have blood, body fluids, and/or secretions or excretions on them. Wear gloves when cleaning surfaces the patient has come in contact with. Use a diluted bleach solution (e.g., dilute bleach with 1 part bleach and 10 parts water) or a household disinfectant with a label that says EPA-registered for coronaviruses. To make a bleach solution at home, add 1 tablespoon of bleach to 1 quart (4 cups) of water. For a larger supply, add   cup of bleach to 1 gallon (16 cups) of water. Read labels of cleaning products and follow recommendations provided on product labels. Labels contain instructions for safe and effective use of the cleaning product including precautions you should take when applying the product, such as wearing gloves or eye protection and making sure you have good ventilation during use of the product. Remove gloves and wash hands immediately after cleaning.  Monitor yourself for signs and symptoms of illness Caregivers and household members are considered close contacts, should monitor their health, and will be asked to limit movement outside of the home to the extent possible. Follow the monitoring steps for close contacts listed on the symptom monitoring form.   ? If you have additional questions, contact your local health department or call the epidemiologist on call at [phone] (available 24/7). ? This guidance is subject to change. For the most up-to-date guidance from [employer], please refer to their website: [employer]pl

## 2019-05-27 LAB — URINE CULTURE: Culture: NO GROWTH

## 2019-05-29 LAB — B. BURGDORFI ANTIBODIES: B burgdorferi Ab IgG+IgM: <0.91 {ISR} (ref 0.00–0.90)

## 2019-05-30 LAB — ROCKY MTN SPOTTED FVR ABS PNL(IGG+IGM)
RMSF IgG: NEGATIVE
RMSF IgM: 0.72 index (ref 0.00–0.89)

## 2019-05-31 LAB — CULTURE, BLOOD (ROUTINE X 2)
Culture: NO GROWTH
Culture: NO GROWTH
Special Requests: ADEQUATE
Special Requests: ADEQUATE

## 2019-09-05 ENCOUNTER — Encounter: Payer: Self-pay | Admitting: Gynecology

## 2019-09-09 ENCOUNTER — Ambulatory Visit (HOSPITAL_COMMUNITY)
Admission: EM | Admit: 2019-09-09 | Discharge: 2019-09-09 | Disposition: A | Discharge status: Home / Self Care | Payer: 59 | Attending: Family Medicine | Admitting: Family Medicine

## 2019-09-09 ENCOUNTER — Encounter (HOSPITAL_COMMUNITY): Payer: Self-pay | Admitting: Emergency Medicine

## 2019-09-09 ENCOUNTER — Other Ambulatory Visit: Payer: Self-pay

## 2019-09-09 DIAGNOSIS — R05 Cough: Secondary | ICD-10-CM | POA: Insufficient documentation

## 2019-09-09 DIAGNOSIS — U071 COVID-19: Secondary | ICD-10-CM | POA: Insufficient documentation

## 2019-09-09 DIAGNOSIS — R509 Fever, unspecified: Secondary | ICD-10-CM | POA: Diagnosis not present

## 2019-09-09 DIAGNOSIS — Z20828 Contact with and (suspected) exposure to other viral communicable diseases: Secondary | ICD-10-CM | POA: Diagnosis not present

## 2019-09-09 DIAGNOSIS — J029 Acute pharyngitis, unspecified: Secondary | ICD-10-CM | POA: Insufficient documentation

## 2019-09-09 LAB — POCT RAPID STREP A: Streptococcus, Group A Screen (Direct): NEGATIVE

## 2019-09-09 MED ORDER — AMOXICILLIN 500 MG PO TABS: 1000.0000 mg | ORAL_TABLET | Freq: Every day | ORAL | 0 refills | Status: DC

## 2019-09-09 MED ORDER — DEXAMETHASONE 6 MG PO TABS: 12.0000 mg | ORAL_TABLET | Freq: Once | ORAL | 0 refills | Status: AC

## 2019-09-09 NOTE — ED Triage Notes (Signed)
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

## 2019-09-09 NOTE — ED Provider Notes (Signed)
  [place]    CSN: [id number] Arrival date & time: 09/09/19  1109  Chief Complaint  Patient presents with  . Fever  . Sore Throat  . Cough    Melanie Howard here for URI complaints.  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 No pertinent past medical history.  BP (!) 150/85 (BP Location: Right Arm)   Pulse 83   Temp (!) 100.8 F (38.2 C) (Oral)   Resp 18   LMP 01/08/2014   SpO2 100%  General: Awake, alert, appears stated age HEENT: AT, [place], ears patent b/l and TM on L neg, right TM with a serous effusion; nares patent w/o discharge, pharynx pink and without exudates, MMM Neck: No masses or asymmetry Heart: RRR Lungs: CTAB, no accessory muscle use Psych: Age appropriate judgment and insight, normal mood and affect   Final Clinical Impressions(s) / UC Diagnoses   Final diagnoses:  Febrile illness, acute   Discussed empirically treating for strep throat versus waiting for culture and COVID testing.  Will send in but she is unlikely to take at this time.  Supportive care.  One-time dose of Decadron for the effusion.   Discharge Instructions     OK to take Tylenol 1000 mg (2 extra strength tabs) or 975 mg (3 regular strength tabs) every 6 hours as needed.  Continue to push fluids, practice good hand hygiene, and cover your mouth if you cough.  If you start having inability to maintain fluid intake, shaking or shortness of breath, seek immediate care.  Throw your toothbrush out after 24 hours of being on antibiotic.     ED Prescriptions    Medication Sig Dispense Auth. Provider   dexamethasone (DECADRON)  6 MG tablet Take 2 tablets (12 mg total) by mouth once for 1 dose. 2 tablet Shelda Pal, DO   amoxicillin (AMOXIL) 500 MG tablet Take 2 tablets (1,000 mg total) by mouth daily for 10 days. 20 tablet Shelda Pal, DO       Shelda Pal, [place] 09/09/19 1249

## 2019-09-09 NOTE — Discharge Instructions (Addendum)
OK to take Tylenol 1000 mg (2 extra strength tabs) or 975 mg (3 regular strength tabs) every 6 hours as needed.  Continue to push fluids, practice good hand hygiene, and cover your mouth if you cough.  If you start having inability to maintain fluid intake, shaking or shortness of breath, seek immediate care.  Throw your toothbrush out after 24 hours of being on antibiotic.

## 2019-09-11 ENCOUNTER — Telehealth (HOSPITAL_COMMUNITY): Payer: Self-pay | Admitting: Emergency Medicine

## 2019-09-11 LAB — CULTURE, GROUP A STREP (THRC)

## 2019-09-11 NOTE — Telephone Encounter (Signed)
covid positive. Patient contacted and made aware of    results, all questions answered Followed by health at work.

## 2019-09-12 LAB — NOVEL CORONAVIRUS, NAA (HOSP ORDER, SEND-OUT TO REF LAB; TAT 18-24 HRS): SARS-CoV-2, NAA: DETECTED — AB

## 2019-09-19 ENCOUNTER — Other Ambulatory Visit: Payer: Self-pay

## 2019-09-19 ENCOUNTER — Ambulatory Visit
Admission: EM | Admit: 2019-09-19 | Discharge: 2019-09-19 | Disposition: A | Discharge status: Home / Self Care | Payer: 59

## 2019-09-19 DIAGNOSIS — R42 Dizziness and giddiness: Secondary | ICD-10-CM

## 2019-09-19 DIAGNOSIS — R05 Cough: Secondary | ICD-10-CM | POA: Diagnosis not present

## 2019-09-19 DIAGNOSIS — U071 COVID-19: Secondary | ICD-10-CM

## 2019-09-19 NOTE — Discharge Instructions (Addendum)
COVID test positive 10+ days ago.   Declines chest x-ray at this time Recommending a few more days off to rest and recover.  Patient still symptomatic.   Get plenty of rest and push fluids Use OTC flonase or zyrtec as needed for congestion, runny nose, and/or sore throat Use OTC medications like ibuprofen or tylenol as needed fever or pain Follow up with Dr. Holly Bodily as needed Call or go to the ED if you have any new or worsening symptoms such as fever, worsening cough, shortness of breath, chest tightness, chest pain, turning blue, changes in mental status, etc..Marland Kitchen

## 2019-09-19 NOTE — ED Provider Notes (Signed)
[place]   [id number] 09/19/19 Arrival Time: [id number]   CC: COVID follow up   SUBJECTIVE: History from: patient.  Melanie Howard is a 58 y.o. female who presents with persistent cough and lightheadedness for the past 10 days.  Was diagnosed with COVID 10 days ago.  Was seen at urgent care and prescribed decadron and amoxicillin.  Reports temporary improvement with decadron, did not take amoxicillin.  States symptoms overall have improved, she just continues to feel "run down."  Denies previous symptoms in the past.   Denies fever, chills, vision changes, sinus pain, rhinorrhea, sore throat, SOB, wheezing, chest pain, nausea, changes in bowel or bladder habits.    ROS: As per HPI.  All other pertinent ROS negative.     History reviewed. No pertinent past medical history. Past Surgical History:  Procedure Laterality Date  . COLPOSCOPY    . GYNECOLOGIC CRYOSURGERY  college   paps normal since  . TUBAL LIGATION     No Known Allergies No current facility-administered medications on file prior to encounter.    Current Outpatient Medications on File Prior to Encounter  Medication Sig Dispense Refill  . Ibuprofen (ADVIL PO) Take by mouth.     Social History   Socioeconomic History  . Marital status: Married    Spouse name: Not on file  . Number of children: Not on file  . Years of education: Not on file  . Highest education level: Not on file  Occupational History  . Not on file  Social Needs  . Financial resource strain: Not on file  . Food insecurity    Worry: Not on file    Inability: Not on file  . Transportation needs    Medical: Not on file    Non-medical: Not on file  Tobacco Use  . Smoking status: Never Smoker  . Smokeless tobacco: Never Used  Substance and Sexual Activity  . Alcohol use: Yes    Comment: Rare  . Drug use: No  . Sexual activity: Yes    Birth control/protection: Surgical    Comment: BTL-1st intercourse 58 yo-Fewer than 5 partners   Lifestyle  . Physical activity    Days per week: Not on file    Minutes per session: Not on file  . Stress: Not on file  Relationships  . Social Herbalist on phone: Not on file    Gets together: Not on file    Attends religious service: Not on file    Active member of club or organization: Not on file    Attends meetings of clubs or organizations: Not on file    Relationship status: Not on file  . Intimate partner violence    Fear of current or ex partner: Not on file    Emotionally abused: Not on file    Physically abused: Not on file    Forced sexual activity: Not on file  Other Topics Concern  . Not on file  Social History Narrative  . Not on file   Family History  Problem Relation Age of Onset  . Hypertension Mother   . Hypertension Father   . Heart disease Father   . Breast cancer Maternal Grandmother        Age unknown    OBJECTIVE:  Vitals:   09/19/19 1105  BP: (!) 144/85  Pulse: 72  Resp: 20  Temp: (!) 97 F (36.1 C)  SpO2: 97%     General appearance: alert; appears fatigued,  but nontoxic; speaking in full sentences and tolerating own secretions HEENT: NCAT; Ears: EACs clear, TMs pearly gray; Eyes: PERRL.  EOM grossly intact. Nose: nares patent without rhinorrhea, Throat: oropharynx clear, tonsils non erythematous or enlarged, uvula midline  Neck: supple without LAD Lungs: unlabored respirations, symmetrical air entry; cough: mild; no respiratory distress; CTAB Heart: regular rate and rhythm.  Radial pulses 2+ symmetrical bilaterally Skin: warm and dry Psychological: alert and cooperative; normal mood and affect  ASSESSMENT & PLAN:  1. COVID-19 virus infection     No orders of the defined types were placed in this encounter.  COVID test positive 10+ days ago.   Declines chest x-ray at this time Recommending a few more days off to rest and recover.  Patient still symptomatic.   Get plenty of rest and push fluids Use OTC flonase or  zyrtec as needed for congestion, runny nose, and/or sore throat Use OTC medications like ibuprofen or tylenol as needed fever or pain Follow up with Dr. Holly Bodily as needed Call or go to the ED if you have any new or worsening symptoms such as fever, worsening cough, shortness of breath, chest tightness, chest pain, turning blue, changes in mental status, etc...   Reviewed expectations re: course of current medical issues. Questions answered. Outlined signs and symptoms indicating need for more acute intervention. Patient verbalized understanding. After Visit Summary given.         Lestine Box, PA-C 09/19/19 1250

## 2019-09-19 NOTE — ED Triage Notes (Signed)
Pt positive for covid 10 days ago and was supposed to return to work but is still not feeling well and , mostly lightheaded

## 2020-07-24 ENCOUNTER — Other Ambulatory Visit: Payer: Self-pay

## 2020-07-24 ENCOUNTER — Ambulatory Visit: Payer: Self-pay

## 2020-07-24 ENCOUNTER — Other Ambulatory Visit: Payer: Self-pay | Admitting: Obstetrics and Gynecology

## 2020-07-24 ENCOUNTER — Ambulatory Visit
Admission: EM | Admit: 2020-07-24 | Discharge: 2020-07-24 | Disposition: A | Discharge status: Home / Self Care | Payer: 59 | Attending: Emergency Medicine | Admitting: Emergency Medicine

## 2020-07-24 DIAGNOSIS — M62838 Other muscle spasm: Secondary | ICD-10-CM

## 2020-07-24 DIAGNOSIS — Z1231 Encounter for screening mammogram for malignant neoplasm of breast: Secondary | ICD-10-CM

## 2020-07-24 MED ORDER — CYCLOBENZAPRINE HCL 5 MG PO TABS: 5.0000 mg | ORAL_TABLET | Freq: Three times a day (TID) | ORAL | 0 refills | Status: DC | PRN

## 2020-07-24 NOTE — Discharge Instructions (Addendum)
Follow-up and establish care with PCP Flexeril was prescribed for muscle spasm Return to to ED for worsening symptoms

## 2020-07-24 NOTE — ED Triage Notes (Signed)
Pt presents with c/o neck spasms and headache for past month

## 2020-07-24 NOTE — ED Provider Notes (Signed)
[place]   [id number] 07/24/20 Arrival Time: 1937   Chief Complaint  Patient presents with   Neck Pain     SUBJECTIVE: History from: patient.  Melanie Howard is a 59 y.o. female who presents to the urgent care with a complaint of neck spasm, headache for the past month.  Report headache is chronic usually located in her frontal lobe.  Stated the spasm is intermittent and not achy.  Report feeling a beat on the left side of her neck. Denies any precipitating event.  Does not have a PCP.  Has not tried any OTC medication.  Denies alleviating or aggravating factors.  He denies similar symptoms in the past.   Denies chills, fever, diplopia, blurry vision, facial droop, confusion, paresthesia.   ROS: As per HPI.  All other pertinent ROS negative.     History reviewed. No pertinent past medical history. Past Surgical History:  Procedure Laterality Date   COLPOSCOPY     GYNECOLOGIC CRYOSURGERY  college   paps normal since   TUBAL LIGATION     No Known Allergies No current facility-administered medications on file prior to encounter.   Current Outpatient Medications on File Prior to Encounter  Medication Sig Dispense Refill   Ibuprofen (ADVIL PO) Take by mouth.     Social History   Socioeconomic History   Marital status: Married    Spouse name: Not on file   Number of children: Not on file   Years of education: Not on file   Highest education level: Not on file  Occupational History   Not on file  Tobacco Use   Smoking status: Never Smoker   Smokeless tobacco: Never Used  Vaping Use   Vaping Use: Never used  Substance and Sexual Activity   Alcohol use: Yes    Comment: Rare   Drug use: No   Sexual activity: Yes    Birth control/protection: Surgical    Comment: BTL-1st intercourse 59 yo-Fewer than 5 partners  Other Topics Concern   Not on file  Social History Narrative   Not on file   Social Determinants of Health   Financial  Resource Strain:    Difficulty of Paying Living Expenses:   Food Insecurity:    Worried About Charity fundraiser in the Last Year:    Arboriculturist in the Last Year:   Transportation Needs:    Film/video editor (Medical):    Lack of Transportation (Non-Medical):   Physical Activity:    Days of Exercise per Week:    Minutes of Exercise per Session:   Stress:    Feeling of Stress :   Social Connections:    Frequency of Communication with Friends and Family:    Frequency of Social Gatherings with Friends and Family:    Attends Religious Services:    Active Member of Clubs or Organizations:    Attends Music therapist:    Marital Status:   Intimate Partner Violence:    Fear of Current or Ex-Partner:    Emotionally Abused:    Physically Abused:    Sexually Abused:    Family History  Problem Relation Age of Onset   Hypertension Mother    Hypertension Father    Heart disease Father    Breast cancer Maternal Grandmother        Age unknown    OBJECTIVE:  Vitals:   07/24/20 1158 07/24/20 1220  BP: (!) 161/94 121/77  Pulse: 67  Resp: 16   Temp: 98.1 F (36.7 C)   TempSrc: Oral   SpO2: 97%      Physical Exam Vitals and nursing note reviewed.  Constitutional:      General: She is not in acute distress.    Appearance: Normal appearance. She is normal weight. She is not ill-appearing, toxic-appearing or diaphoretic.  HENT:     Head: Normocephalic.  Neck:     Vascular: No carotid bruit.  Cardiovascular:     Rate and Rhythm: Normal rate and regular rhythm.     Pulses: Normal pulses.     Heart sounds: Normal heart sounds. No murmur heard.  No friction rub. No gallop.   Pulmonary:     Effort: Pulmonary effort is normal. No respiratory distress.     Breath sounds: Normal breath sounds. No stridor. No wheezing, rhonchi or rales.  Chest:     Chest wall: No tenderness.  Musculoskeletal:     Cervical back: Normal range of motion.  Spasms present. No erythema, rigidity, torticollis or tenderness.  Lymphadenopathy:     Cervical: No cervical adenopathy.  Neurological:     General: No focal deficit present.     Mental Status: She is alert and oriented to person, place, and time. Mental status is at baseline.     GCS: GCS eye subscore is 4. GCS verbal subscore is 5. GCS motor subscore is 6.     Cranial Nerves: Cranial nerves are intact. No cranial nerve deficit.     Sensory: Sensation is intact. No sensory deficit.     Motor: Motor function is intact. No weakness.     Coordination: Coordination is intact. Coordination normal.     Gait: Gait is intact. Gait normal.     Deep Tendon Reflexes: Reflexes normal.     LABS:  No results found for this or any previous visit (from the past 24 hour(s)).   ASSESSMENT & PLAN:  1. Neck muscle spasm     Meds ordered this encounter  Medications   cyclobenzaprine (FLEXERIL) 5 MG tablet    Sig: Take 1 tablet (5 mg total) by mouth 3 (three) times daily as needed.    Dispense:  30 tablet    Refill:  0    Discharge instructions  Follow-up and establish care with PCP Flexeril was prescribed for muscle spasm Return to to ED for worsening symptoms   Reviewed expectations re: course of current medical issues. Questions answered. Outlined signs and symptoms indicating need for more acute intervention. Patient verbalized understanding. After Visit Summary given.      Note: This document was prepared using Dragon voice recognition software and may include unintentional dictation errors.    Emerson Monte, FNP 07/24/20 1228

## 2020-07-26 ENCOUNTER — Ambulatory Visit (INDEPENDENT_AMBULATORY_CARE_PROVIDER_SITE_OTHER): Payer: 59 | Admitting: Obstetrics and Gynecology

## 2020-07-26 ENCOUNTER — Encounter: Payer: Self-pay | Admitting: Obstetrics and Gynecology

## 2020-07-26 ENCOUNTER — Other Ambulatory Visit: Payer: Self-pay

## 2020-07-26 VITALS — BP 118/74 | Ht 62.0 in | Wt 133.0 lb

## 2020-07-26 DIAGNOSIS — Z01419 Encounter for gynecological examination (general) (routine) without abnormal findings: Secondary | ICD-10-CM

## 2020-07-26 DIAGNOSIS — Z1329 Encounter for screening for other suspected endocrine disorder: Secondary | ICD-10-CM | POA: Diagnosis not present

## 2020-07-26 DIAGNOSIS — Z8639 Personal history of other endocrine, nutritional and metabolic disease: Secondary | ICD-10-CM

## 2020-07-26 LAB — HM PAP SMEAR

## 2020-07-26 NOTE — Progress Notes (Signed)
LOXLEY CIBRIAN 06-17-1961 [id number]  SUBJECTIVE:  59 y.o. G3P3 female for annual routine gynecologic exam and Pap smear. She has no gynecologic concerns.  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  Did have COVID-19 last fall.  Has recently had more headaches and also a intermittent flutter in her left neck.   Current Outpatient Medications  Medication Sig Dispense Refill   Ibuprofen (ADVIL PO) Take by mouth.     No current facility-administered medications for this visit.   Allergies: Patient has no known allergies.  Patient's last menstrual period was 01/08/2014.  Past medical history,surgical history, problem list, medications, allergies, family history and social history were all reviewed and documented as reviewed in the EPIC chart.  ROS:  Feeling well. No dyspnea or chest pain on exertion.  No abdominal pain, change in bowel habits, black or bloody stools.  No urinary tract symptoms. GYN ROS: no abnormal bleeding, pelvic pain or discharge, no breast pain or new or enlarging lumps on self exam.No neurological complaints.   OBJECTIVE:  BP 118/74    Ht 5\' 2"  (1.575 m)    Wt 133 lb (60.3 kg)    LMP 01/08/2014    BMI 24.33 kg/m  The patient appears well, alert, oriented x 3, in no distress. ENT normal.  Neck supple. No cervical or supraclavicular adenopathy or thyromegaly.  Lungs are clear, good air entry, no wheezes, rhonchi or rales. S1 and S2 normal, no murmurs, regular rate and rhythm.  Abdomen soft without tenderness, guarding, mass or organomegaly.  Neurological is normal, no focal findings.  BREAST EXAM: breasts appear normal, no suspicious masses, no skin or nipple changes or axillary nodes  PELVIC EXAM: VULVA: normal appearing vulva with no masses, tenderness or lesions, VAGINA: normal appearing vagina with normal color and discharge, grade 2 cystocele, no significant rectocele, no lesions, CERVIX: Mild uterine/cervical prolapse, normal appearing cervix without discharge or lesions,  UTERUS: uterus is normal size, shape, consistency and nontender, ADNEXA: normal adnexa in size, nontender and no masses  Chaperone: Caryn Bee present during the examination  ASSESSMENT:  58 y.o. G3P3 here for annual gynecologic exam  PLAN:   1. Postmenopausal.  No significant hot flashes or night sweats.  No vaginal bleeding. 2. Cystocele.  Also was evaluated by urology for urinary incontinence but did not follow through with further work-up or evaluation.  Cystocele is not overly bothersome to her right now.  We discussed signs and symptoms of worsening condition for which she might want to seek evaluation.  We also reviewed Kegel exercises, pelvic floor PT referral, pessary, and surgery.  She will let us know she would want to proceed with any of those options. 3. Pap smear/HPV 01/2018.  History of cryosurgery in college.  No abnormal Pap smears since.  Next Pap smear due 2024 following the current guidelines recommending the 5 year interval. 4. Mammogram 01/2018.  Normal breast exam today.  She says she has an annual mammogram scheduled next week. 5. Colonoscopy never.  We again reviewed the importance of colon cancer screening and this is among most common cancers diagnosed in early stage detection leads to best outcomes.  Names and contacts available upon request. 6. DEXA never.  Would plan to do this a little later into menopause around age 64.  We discussed the importance of supplementing with vitamin D, will check level today.  Also would be good to supplement with calcium. 7.  Headaches and neck symptoms.  No abnormalities on the neck exam today, but  I would recommend that she establish with a primary internist or family doctor to further evaluate these concerns.  She mentions that her blood pressure has been elevated on occasion when visiting a medical facility or when checking it on her own, but she is normotensive here today.  If any severe elevation of blood pressure she should be  immediately evaluated. 8. Health maintenance.  She will proceed to lab today for routine screening blood work (lipids, CBC, CMP, TSH, vitamin D level due to history of deficiency).  Return annually or sooner, prn.  Joseph Pierini MD 07/26/20

## 2020-07-27 LAB — COMPREHENSIVE METABOLIC PANEL
AG Ratio: 2.1 (calc) (ref 1.0–2.5)
ALT: 22 U/L (ref 6–29)
AST: 19 U/L (ref 10–35)
Albumin: 4.5 g/dL (ref 3.6–5.1)
Alkaline phosphatase (APISO): 83 U/L (ref 37–153)
BUN: 12 mg/dL (ref 7–25)
CO2: 29 mmol/L (ref 20–32)
Calcium: 9.1 mg/dL (ref 8.6–10.4)
Chloride: 103 mmol/L (ref 98–110)
Creat: 0.72 mg/dL (ref 0.50–1.05)
Globulin: 2.1 g/dL (calc) (ref 1.9–3.7)
Glucose, Bld: 81 mg/dL (ref 65–99)
Potassium: 4.1 mmol/L (ref 3.5–5.3)
Sodium: 140 mmol/L (ref 135–146)
Total Bilirubin: 0.5 mg/dL (ref 0.2–1.2)
Total Protein: 6.6 g/dL (ref 6.1–8.1)

## 2020-07-27 LAB — LIPID PANEL
Cholesterol: 220 mg/dL — ABNORMAL HIGH (ref <200)
HDL: 61 mg/dL (ref >=50)
LDL Cholesterol (Calc): 132 mg/dL (calc) — ABNORMAL HIGH
Non-HDL Cholesterol (Calc): 159 mg/dL (calc) — ABNORMAL HIGH (ref <130)
Total CHOL/HDL Ratio: 3.6 (calc) (ref <5.0)
Triglycerides: 156 mg/dL — ABNORMAL HIGH (ref <150)

## 2020-07-27 LAB — CBC
HCT: 39.2 % (ref 35.0–45.0)
Hemoglobin: 13.5 g/dL (ref 11.7–15.5)
MCH: 30.8 pg (ref 27.0–33.0)
MCHC: 34.4 g/dL (ref 32.0–36.0)
MCV: 89.5 fL (ref 80.0–100.0)
MPV: 9.4 fL (ref 7.5–12.5)
Platelets: 242 10*3/uL (ref 140–400)
RBC: 4.38 10*6/uL (ref 3.80–5.10)
RDW: 12.7 % (ref 11.0–15.0)
WBC: 7.8 10*3/uL (ref 3.8–10.8)

## 2020-07-27 LAB — VITAMIN D 25 HYDROXY (VIT D DEFICIENCY, FRACTURES): Vit D, 25-Hydroxy: 20 ng/mL — ABNORMAL LOW (ref 30–100)

## 2020-07-27 LAB — TSH: TSH: 1.91 mIU/L (ref 0.40–4.50)

## 2020-08-01 ENCOUNTER — Ambulatory Visit
Admission: RE | Admit: 2020-08-01 | Discharge: 2020-08-01 | Disposition: A | Discharge status: Home / Self Care | Payer: 59 | Source: Ambulatory Visit | Attending: Obstetrics and Gynecology | Admitting: Obstetrics and Gynecology

## 2020-08-01 ENCOUNTER — Other Ambulatory Visit: Payer: Self-pay

## 2020-08-01 DIAGNOSIS — Z1231 Encounter for screening mammogram for malignant neoplasm of breast: Secondary | ICD-10-CM | POA: Diagnosis not present

## 2020-08-20 ENCOUNTER — Ambulatory Visit (INDEPENDENT_AMBULATORY_CARE_PROVIDER_SITE_OTHER): Payer: 59 | Admitting: Nurse Practitioner

## 2020-09-05 ENCOUNTER — Encounter (INDEPENDENT_AMBULATORY_CARE_PROVIDER_SITE_OTHER): Payer: Self-pay | Admitting: Nurse Practitioner

## 2020-09-05 ENCOUNTER — Other Ambulatory Visit: Payer: Self-pay

## 2020-09-05 ENCOUNTER — Telehealth (INDEPENDENT_AMBULATORY_CARE_PROVIDER_SITE_OTHER): Payer: Self-pay | Admitting: Nurse Practitioner

## 2020-09-05 ENCOUNTER — Ambulatory Visit (INDEPENDENT_AMBULATORY_CARE_PROVIDER_SITE_OTHER): Payer: 59 | Admitting: Nurse Practitioner

## 2020-09-05 VITALS — BP 162/86 | HR 64 | Temp 97.5°F | Ht 62.0 in | Wt 133.4 lb

## 2020-09-05 DIAGNOSIS — R030 Elevated blood-pressure reading, without diagnosis of hypertension: Secondary | ICD-10-CM

## 2020-09-05 DIAGNOSIS — E785 Hyperlipidemia, unspecified: Secondary | ICD-10-CM | POA: Insufficient documentation

## 2020-09-05 DIAGNOSIS — R1031 Right lower quadrant pain: Secondary | ICD-10-CM

## 2020-09-05 DIAGNOSIS — G4452 New daily persistent headache (NDPH): Secondary | ICD-10-CM | POA: Diagnosis not present

## 2020-09-05 DIAGNOSIS — E559 Vitamin D deficiency, unspecified: Secondary | ICD-10-CM

## 2020-09-05 NOTE — Progress Notes (Signed)
Subjective:  Patient ID: Melanie Howard, female    DOB: [birth year]  Age: 59 y.o. MRN: [id number]  CC:  Chief Complaint  Patient presents with  . Establish Care  . Headache  . Other    Elevated Blood pressure, pain in groin, "neck twinge"      HPI  This patient arrives today for the above.  She is establishing care this office.  She tells me she has not been following with a primary care provider for very long time.  She tells me she wants to see a primary care provider now because last time she had blood work completed her OB/GYN she was told that her cholesterol numbers were elevated.  She is also been told in the past that her blood pressure is running high.  Per chart review I do see that last month she had metabolic panel, lipid panel, vitamin D level, CBC, and TSH drawn.  Only abnormalities include hyperlipidemia and vitamin D level of 20.  She is on vitamin D supplement at this time.  She has multiple acute complaints today.  They include headache, pain in her right groin, and a neck twinge.  As far as her headache goes this is been going on for approximately 6 months, it is a new type of headache that she has experienced previously in her life.  She does localize it to left frontal aspect of her head.  She denies any nausea, vomiting, dizziness, loss of consciousness, or visual changes.  She tells me the pain can get quite severe and will wake her up at night.  She tells me she has this headache almost every day/evening, which is a progression as she is only experiencing it here and there when the headache first started.  She also tells me she has been experiencing right groin pain the last couple of months.  [place] and at first thought maybe she pulled something, but the pain has been getting progressively worse and it limits her range of motion.  She tells me it does not affect her ability to complete daily tasks, and the pain is mild to moderate in nature  but it is concerning to her that she is been losing flexibility because of it.  Also the pain is now started radiating to her hip.  As far as the neck twinge goes she reports intermittent twinging type feeling to the left of her neck.  She is concerned that maybe she is to have elevated blood pressure for quite some time and she may have a blockage in her carotid artery.  History reviewed. No pertinent past medical history.    Family History  Problem Relation Age of Onset  . Hypertension Mother   . Hypertension Father   . Heart disease Father   . Breast cancer Maternal Grandmother        Age unknown  . Depression Sister   . Restless legs syndrome Sister   . Hypertension Brother     Social History   Social History Narrative  . Not on file   Social History   Tobacco Use  . Smoking status: Never Smoker  . Smokeless tobacco: Never Used  Substance Use Topics  . Alcohol use: Yes    Comment: Rare; socially on weekends     Current Meds  Medication Sig  . Cholecalciferol 250 MCG (10000 UT) CAPS Take 2 capsules by mouth.  . Ibuprofen (ADVIL PO) Take by mouth.  ROS:  Review of Systems  Eyes: Negative for blurred vision.  Respiratory: Negative for shortness of breath.   Cardiovascular: Negative for chest pain.  Neurological: Positive for headaches. Negative for dizziness.     Objective:   Today's Vitals: BP (!) 162/86   Pulse 64   Temp (!) 97.5 F (36.4 C)   Ht 5\' 2"  (1.575 m)   Wt 133 lb 6.4 oz (60.5 kg)   LMP 01/08/2014   SpO2 98%   BMI 24.40 kg/m  Vitals with BMI 09/05/2020 07/26/2020 07/24/2020  Height 5\' 2"  5\' 2"  -  Weight 133 lbs 6 oz 133 lbs -  BMI 18.29 93.71 -  Systolic 696 789 381  Diastolic 86 74 77  Pulse 64 - -     Physical Exam Vitals reviewed.  Constitutional:      General: She is not in acute distress.    Appearance: Normal appearance.  HENT:     Head: Normocephalic and atraumatic.  Neck:     Vascular: No carotid bruit.    Cardiovascular:     Rate and Rhythm: Normal rate and regular rhythm.     Pulses: Normal pulses.     Heart sounds: Normal heart sounds.  Pulmonary:     Effort: Pulmonary effort is normal.     Breath sounds: Normal breath sounds.  Abdominal:     Tenderness: There is no abdominal tenderness.     Hernia: No hernia is present.  Musculoskeletal:     Cervical back: Normal. No bony tenderness.     Thoracic back: Normal. No bony tenderness.     Lumbar back: Normal. No bony tenderness.  Skin:    General: Skin is warm and dry.  Neurological:     General: No focal deficit present.     Mental Status: She is alert and oriented to person, place, and time.     Motor: Motor function is intact.     Gait: Gait is intact.  Psychiatric:        Mood and Affect: Mood normal.        Behavior: Behavior normal.        Judgment: Judgment normal.          Assessment and Plan   1. Hyperlipidemia, unspecified hyperlipidemia type   2. Elevated blood pressure reading   3. Vitamin D deficiency   4. Right groin pain   5. New daily persistent headache      Plan: 1.,  2.  We talked about treatment of her elevated blood pressure and she would prefer to start with lifestyle changes.  I did discuss that her headache is concerning to me as it may be associated with her blood pressure, however she would like to still hold off and focus on diet and exercise.  I did warn her that if she experiences any severe headache, chest pain, shortness of breath, acute onset of difficulty talking/swallowing, weakness or sensation changes she needs to call 911.  She tells me she understands.  I encouraged her to consider trying Mediterranean diet and to try to walk for 30 minutes at least 3 times a week.  She tells me she will try to do this.  She will return in 1 month and if her blood pressure remains elevated will need to start her on medication.  She was also told to get a blood pressure cuff and monitor at home she tells  me she will do this. 2.  She will continue on her vitamin  D3 supplement, consider checking blood work in the near future to monitor serum level. 4.  I did not note any hernia to her groin, and based on history I think it is most likely a soft tissue injury as opposed to skeletal.  For now we will refer her to physical therapy for evaluation management of the pain, but she was told if pain persists we will have to consider imaging or referral to orthopedist.  She tells me she understands. 5.  I am very concerned that this headache as it initiated after 59 years of age, it is getting progressive, and it severe enough to wake her up at night.  I am going to order MRI of the brain for further evaluation.  As far as the neck twinges concerned we will order ultrasound of carotid arteries.   Tests ordered Orders Placed This Encounter  Procedures  . MR Brain W Wo Contrast  . US Carotid Duplex Bilateral  . Ambulatory referral to Physical Therapy      No orders of the defined types were placed in this encounter.   Patient to follow-up in 4 weeks or sooner as needed  Ailene Ards, NP

## 2020-09-05 NOTE — Telephone Encounter (Signed)
I ordered MRI and Carotid artery ultrasound for patient today. Please make sure this is run through insurance and scheduled.

## 2020-09-05 NOTE — Patient Instructions (Addendum)
Mediterranean Diet A Mediterranean diet refers to food and lifestyle choices that are based on the traditions of countries located on the [employer]. This way of eating has been shown to help prevent certain conditions and improve outcomes for people who have chronic diseases, like kidney disease and heart disease. What are tips for following this plan? Lifestyle  Cook and eat meals together with your family, when possible.  Drink enough fluid to keep your urine clear or pale yellow.  Be physically active every day. This includes: ? Aerobic exercise like running or swimming. ? Leisure activities like gardening, walking, or housework.  Get 7-8 hours of sleep each night.  If recommended by your health care provider, drink red wine in moderation. This means 1 glass a day for nonpregnant women and 2 glasses a day for men. A glass of wine equals 5 oz (150 mL). Reading food labels   Check the serving size of packaged foods. For foods such as rice and pasta, the serving size refers to the amount of cooked product, not dry.  Check the total fat in packaged foods. Avoid foods that have saturated fat or trans fats.  Check the ingredients list for added sugars, such as corn syrup. Shopping  At the grocery store, buy most of your food from the areas near the walls of the store. This includes: ? Fresh fruits and vegetables (produce). ? Grains, beans, nuts, and seeds. Some of these may be available in unpackaged forms or large amounts (in bulk). ? Fresh seafood. ? Poultry and eggs. ? Low-fat dairy products.  Buy whole ingredients instead of prepackaged foods.  Buy fresh fruits and vegetables in-season from local farmers markets.  Buy frozen fruits and vegetables in resealable bags.  If you do not have access to quality fresh seafood, buy precooked frozen shrimp or canned fish, such as tuna, salmon, or sardines.  Buy small amounts of raw or cooked vegetables, salads, or olives from  the deli or salad bar at your store.  Stock your pantry so you always have certain foods on hand, such as olive oil, canned tuna, canned tomatoes, rice, pasta, and beans. Cooking  Cook foods with extra-virgin olive oil instead of using butter or other vegetable oils.  Have meat as a side dish, and have vegetables or grains as your main dish. This means having meat in small portions or adding small amounts of meat to foods like pasta or stew.  Use beans or vegetables instead of meat in common dishes like chili or lasagna.  Experiment with different cooking methods. Try roasting or broiling vegetables instead of steaming or sauteing them.  Add frozen vegetables to soups, stews, pasta, or rice.  Add nuts or seeds for added healthy fat at each meal. You can add these to yogurt, salads, or vegetable dishes.  Marinate fish or vegetables using olive oil, lemon juice, garlic, and fresh herbs. Meal planning   Plan to eat 1 vegetarian meal one day each week. Try to work up to 2 vegetarian meals, if possible.  Eat seafood 2 or more times a week.  Have healthy snacks readily available, such as: ? Vegetable sticks with hummus. ? [place] yogurt. ? Fruit and nut trail mix.  Eat balanced meals throughout the week. This includes: ? Fruit: 2-3 servings a day ? Vegetables: 4-5 servings a day ? Low-fat dairy: 2 servings a day ? Fish, poultry, or lean meat: 1 serving a day ? Beans and legumes: 2 or more servings a week ?  Nuts and seeds: 1-2 servings a day ? Whole grains: 6-8 servings a day ? Extra-virgin olive oil: 3-4 servings a day  Limit red meat and sweets to only a few servings a month What are my food choices?  Mediterranean diet ? Recommended  Grains: Whole-grain pasta. Brown rice. Bulgar wheat. Polenta. Couscous. Whole-wheat bread. [place].  Vegetables: Artichokes. Beets. Broccoli. Cabbage. Carrots. Eggplant. Green beans. Chard. Kale. Spinach. Onions. Leeks. Peas. Squash.  Tomatoes. Peppers. Radishes.  Fruits: Apples. Apricots. Avocado. Berries. Bananas. Cherries. Dates. Figs. Grapes. Lemons. Melon. Oranges. Peaches. Plums. Pomegranate.  Meats and other protein foods: Beans. Almonds. Sunflower seeds. Pine nuts. Peanuts. [place]. Salmon. Scallops. Shrimp. [place]. Tilapia. Clams. Oysters. Eggs.  Dairy: Low-fat milk. Cheese. Greek yogurt.  Beverages: Water. Red wine. Herbal tea.  Fats and oils: Extra virgin olive oil. Avocado oil. Grape seed oil.  Sweets and desserts: [place] yogurt with honey. Baked apples. Poached pears. Trail mix.  Seasoning and other foods: Basil. Cilantro. Coriander. Cumin. Mint. Parsley. Sage. Rosemary. Tarragon. Garlic. Oregano. Thyme. Pepper. Balsalmic vinegar. Tahini. Hummus. Tomato sauce. Olives. Mushrooms. ? Limit these  Grains: Prepackaged pasta or rice dishes. Prepackaged cereal with added sugar.  Vegetables: Deep fried potatoes (french fries).  Fruits: Fruit canned in syrup.  Meats and other protein foods: Beef. Pork. Lamb. Poultry with skin. Hot dogs. [place] Salines.  Dairy: Ice cream. Sour cream. Whole milk.  Beverages: Juice. Sugar-sweetened soft drinks. Beer. Liquor and spirits.  Fats and oils: Butter. Canola oil. Vegetable oil. Beef fat (tallow). Lard.  Sweets and desserts: Cookies. Cakes. Pies. Candy.  Seasoning and other foods: Mayonnaise. Premade sauces and marinades. The items listed may not be a complete list. Talk with your dietitian about what dietary choices are right for you. Summary  The Mediterranean diet includes both food and lifestyle choices.  Eat a variety of fresh fruits and vegetables, beans, nuts, seeds, and whole grains.  Limit the amount of red meat and sweets that you eat.  Talk with your health care provider about whether it is safe for you to drink red wine in moderation. This means 1 glass a day for nonpregnant women and 2 glasses a day for men. A glass of wine equals 5 oz (150 mL). This information  is not intended to replace advice given to you by your health care provider. Make sure you discuss any questions you have with your health care provider. Document Revised: 07/23/2016 Document Reviewed: 07/16/2016 Elsevier Patient Education  2020 [facility] Dietary Recommendations for Weight Loss What to Avoid . Avoid added sugars o Often added sugar can be found in processed foods such as many condiments, dry cereals, cakes, cookies, chips, crisps, crackers, candies, sweetened drinks, etc.  o Read labels and AVOID/DECREASE use of foods with the following in their ingredient list: Sugar, fructose, high fructose corn syrup, sucrose, glucose, maltose, dextrose, molasses, cane sugar, brown sugar, any type of syrup, agave nectar, etc.   . Avoid snacking in between meals . Avoid foods made with flour o If you are going to eat food made with flour, choose those made with whole-grains; and, minimize your consumption as much as is tolerable . Avoid processed foods o These foods are generally stocked in the middle of the grocery store. Focus on shopping on the perimeter of the grocery.  . Avoid Meat  o We recommend following a plant-based diet at [facility]. Thus, we recommend avoiding meat as a general rule. Consider eating beans, legumes, eggs,  and/or dairy products for regular protein sources o If you plan on eating meat limit to 4 ounces of meat at a time and choose lean options such as Fish, chicken, [place]. Avoid red meat intake such as pork and/or steak What to Include . Vegetables o GREEN LEAFY VEGETABLES: Kale, spinach, mustard greens, collard greens, cabbage, broccoli, etc. o OTHER: Asparagus, cauliflower, eggplant, carrots, peas, Brussel sprouts, tomatoes, bell peppers, zucchini, beets, cucumbers, etc. . Grains, seeds, and legumes o Beans: kidney beans, black eyed peas, garbanzo beans, black beans, pinto beans, etc. o Whole, unrefined grains: brown  rice, barley, bulgur, oatmeal, etc. . Healthy fats  o Avoid highly processed fats such as vegetable oil o Examples of healthy fats: avocado, olives, virgin olive oil, dark chocolate (?72% Cocoa), nuts (peanuts, almonds, walnuts, cashews, pecans, etc.) . None to Low Intake of Animal Sources of Protein o Meat sources: chicken, [place], salmon, tuna. Limit to 4 ounces of meat at one time. o Consider limiting dairy sources, but when choosing dairy focus on: PLAIN [place] yogurt, cottage cheese, high-protein milk . Fruit o Choose berries  When to Eat . Intermittent Fasting: o Choosing not to eat for a specific time period, but DO FOCUS ON HYDRATION when fasting o Multiple Techniques: - Time Restricted Eating: eat 3 meals in a day, each meal lasting no more than 60 minutes, no snacks between meals - 16-18 hour fast: fast for 16 to 18 hours up to 7 days a week. Often suggested to start with 2-3 nonconsecutive days per week.  . Remember the time you sleep is counted as fasting.  . Examples of eating schedule: Fast from 7:00pm-11:00am. Eat between 11:00am-7:00pm.  - 24-hour fast: fast for 24 hours up to every other day. Often suggested to start with 1 day per week . Remember the time you sleep is counted as fasting . Examples of eating schedule:  o Eating day: eat 2-3 meals on your eating day. If doing 2 meals, each meal should last no more than 90 minutes. If doing 3 meals, each meal should last no more than 60 minutes. Finish last meal by 7:00pm. o Fasting day: Fast until 7:00pm.  o IF YOU FEEL UNWELL FOR ANY REASON/IN ANY WAY WHEN FASTING, STOP FASTING BY EATING A NUTRITIOUS SNACK OR LIGHT MEAL o ALWAYS FOCUS ON HYDRATION DURING FASTS - Acceptable Hydration sources: water, broths, tea/coffee (black tea/coffee is best but using a small amount of whole-fat dairy products in coffee/tea is acceptable).  - Poor Hydration Sources: anything with sugar or artificial sweeteners added to it  These  recommendations have been developed for patients that are actively receiving medical care from either Dr. Anastasio Champion or Jeralyn Ruths, DNP, NP-C at [facility]. These recommendations are developed for patients with specific medical conditions and are not meant to be distributed or used by others that are not actively receiving care from either provider listed above at [facility]. It is not appropriate to participate in the above eating plans without proper medical supervision.   Reference: Rexanne Mano. The obesity code. [place]; 2016.

## 2020-09-19 ENCOUNTER — Encounter (INDEPENDENT_AMBULATORY_CARE_PROVIDER_SITE_OTHER): Payer: Self-pay | Admitting: Nurse Practitioner

## 2020-09-23 NOTE — Telephone Encounter (Signed)
Yes ordered but having to get Auth.

## 2020-10-02 ENCOUNTER — Ambulatory Visit (HOSPITAL_COMMUNITY)
Admission: RE | Admit: 2020-10-02 | Discharge: 2020-10-02 | Disposition: A | Discharge status: Home / Self Care | Payer: 59 | Source: Ambulatory Visit | Attending: Nurse Practitioner | Admitting: Nurse Practitioner

## 2020-10-02 ENCOUNTER — Other Ambulatory Visit (INDEPENDENT_AMBULATORY_CARE_PROVIDER_SITE_OTHER): Payer: Self-pay | Admitting: Nurse Practitioner

## 2020-10-02 ENCOUNTER — Other Ambulatory Visit: Payer: Self-pay

## 2020-10-02 DIAGNOSIS — G9389 Other specified disorders of brain: Secondary | ICD-10-CM | POA: Diagnosis not present

## 2020-10-02 DIAGNOSIS — J0100 Acute maxillary sinusitis, unspecified: Secondary | ICD-10-CM | POA: Diagnosis not present

## 2020-10-02 DIAGNOSIS — G930 Cerebral cysts: Secondary | ICD-10-CM

## 2020-10-02 DIAGNOSIS — G4452 New daily persistent headache (NDPH): Secondary | ICD-10-CM | POA: Diagnosis not present

## 2020-10-02 DIAGNOSIS — R519 Headache, unspecified: Secondary | ICD-10-CM | POA: Diagnosis not present

## 2020-10-02 DIAGNOSIS — I6522 Occlusion and stenosis of left carotid artery: Secondary | ICD-10-CM | POA: Diagnosis not present

## 2020-10-02 DIAGNOSIS — Q283 Other malformations of cerebral vessels: Secondary | ICD-10-CM | POA: Diagnosis not present

## 2020-10-02 MED ORDER — GADOBUTROL 1 MMOL/ML IV SOLN
7.0000 mL | Freq: Once | INTRAVENOUS | Status: AC | PRN
  Administered 2020-10-02: 7 mL via INTRAVENOUS

## 2020-10-02 NOTE — Progress Notes (Signed)
Urgent referral to neurosurgery

## 2020-10-03 ENCOUNTER — Other Ambulatory Visit (INDEPENDENT_AMBULATORY_CARE_PROVIDER_SITE_OTHER): Payer: Self-pay | Admitting: Nurse Practitioner

## 2020-10-03 ENCOUNTER — Encounter (INDEPENDENT_AMBULATORY_CARE_PROVIDER_SITE_OTHER): Payer: Self-pay | Admitting: Nurse Practitioner

## 2020-10-03 ENCOUNTER — Ambulatory Visit (INDEPENDENT_AMBULATORY_CARE_PROVIDER_SITE_OTHER): Payer: 59 | Admitting: Nurse Practitioner

## 2020-10-03 VITALS — BP 138/88 | HR 62 | Temp 97.7°F | Ht 62.0 in | Wt 132.4 lb

## 2020-10-03 DIAGNOSIS — I10 Essential (primary) hypertension: Secondary | ICD-10-CM | POA: Insufficient documentation

## 2020-10-03 DIAGNOSIS — E559 Vitamin D deficiency, unspecified: Secondary | ICD-10-CM

## 2020-10-03 DIAGNOSIS — G939 Disorder of brain, unspecified: Secondary | ICD-10-CM | POA: Insufficient documentation

## 2020-10-03 DIAGNOSIS — I6522 Occlusion and stenosis of left carotid artery: Secondary | ICD-10-CM | POA: Insufficient documentation

## 2020-10-03 MED ORDER — AMLODIPINE BESYLATE 2.5 MG PO TABS: 2.5000 mg | ORAL_TABLET | Freq: Every day | ORAL | 1 refills | Status: DC

## 2020-10-03 MED FILL — AMLODIPINE 2.5 MG TABLET: 2.5 | 90 days supply | Qty: 90 | Fill #0

## 2020-10-03 NOTE — Progress Notes (Signed)
Patient had a visit today and was given the message from Judson Roch. Patient verbalized an understanding.

## 2020-10-03 NOTE — Progress Notes (Signed)
all records and referral submitted over this mornign.

## 2020-10-03 NOTE — Progress Notes (Signed)
Subjective:  Patient ID: Melanie Howard, female    DOB: 1961-11-27  Age: 59 y.o. MRN: [id number]  CC:  Chief Complaint  Patient presents with  . Follow-up    Doing okay  . Headache  . Hypertension  . Other    Vitamin D Deficiency      HPI  This patient arrives today for the above.  She was last seen in this office approximately 1 month ago at which point she was establishing care.  At that time she did mention that she is been having a new persistent daily headache.  Today she tells me she continues to have a headache especially at night when she is laying down, the pain is severe enough to wake her up at night.  She did undergo MRI of the brain and did show lesion that may be a cavernoma.  I did consult with Dr. Anastasio Champion regarding these results and we have urgently referred her to neurosurgery for further evaluation and management of this.  She also underwent bilateral carotid artery ultrasound which did show mild to moderate stenosis of left carotid artery.  Referral to vascular surgery was made as well for this.  She was also noted to have elevated blood pressure last office visit and she has been trying to make some lifestyle changes since that visit to improve her blood pressure.  She tells me she has made changes such as reducing her intake of [place] fries, chips and increasing her intake of fish and veggies.  She does monitor her blood pressure at home and tells me that her systolic blood pressure ranges from 294-765, and her diastolic blood pressure ranges from 82-92.  She is hesitant to start on medication if she does not need it because she tells me she is quite sensitive to most medicines.  She also does have a history of vitamin D deficiency and is supposed to be taking a vitamin D3 supplement, but reports she does not take this very consistently.  History reviewed. No pertinent past medical history.    Family History  Problem Relation Age of Onset  . Hypertension  Mother   . Hypertension Father   . Heart disease Father   . Breast cancer Maternal Grandmother        Age unknown  . Depression Sister   . Restless legs syndrome Sister   . Hypertension Brother     Social History   Social History Narrative  . Not on file   Social History   Tobacco Use  . Smoking status: Never Smoker  . Smokeless tobacco: Never Used  Substance Use Topics  . Alcohol use: Yes    Comment: Rare; socially on weekends     Current Meds  Medication Sig  . Cholecalciferol 250 MCG (10000 UT) CAPS Take 2 capsules by mouth.  . Ibuprofen (ADVIL PO) Take by mouth.    ROS:  See HPI   Objective:   Today's Vitals: BP 138/88   Pulse 62   Temp 97.7 F (36.5 C) (Temporal)   Ht 5\' 2"  (1.575 m)   Wt 132 lb 6.4 oz (60.1 kg)   LMP 01/08/2014   SpO2 97%   BMI 24.22 kg/m  Vitals with BMI 10/03/2020 09/05/2020 07/26/2020  Height 5\' 2"  5\' 2"  5\' 2"   Weight 132 lbs 6 oz 133 lbs 6 oz 133 lbs  BMI 24.21 46.50 35.46  Systolic 568 127 517  Diastolic 88 86 74  Pulse 62 64 -  Physical Exam Vitals reviewed.  Constitutional:      General: She is not in acute distress.    Appearance: Normal appearance.  HENT:     Head: Normocephalic and atraumatic.  Neck:     Vascular: No carotid bruit.  Cardiovascular:     Rate and Rhythm: Normal rate and regular rhythm.     Pulses: Normal pulses.     Heart sounds: Normal heart sounds.  Pulmonary:     Effort: Pulmonary effort is normal.     Breath sounds: Normal breath sounds.  Skin:    General: Skin is warm and dry.  Neurological:     General: No focal deficit present.     Mental Status: She is alert and oriented to person, place, and time.  Psychiatric:        Mood and Affect: Mood normal.        Behavior: Behavior normal.        Judgment: Judgment normal.          Assessment and Plan   1. Brain lesion   2. Hypertension, unspecified type   3. Vitamin D deficiency   4. Left carotid artery stenosis       Plan: 1.,  2., 4.  I did discuss her MRI results with her today and told her that if she does not hear from neurosurgery's office to schedule an appointment within the next 2 weeks to please let me know.  We also discussed trying to elevate her head at night to see if this results in reduced head pain at night, she plans on trying to do this.  We discussed that because there was evidence that she may have had some bleeding from this lesion previously, tight control of blood pressure may be valuable in reducing additional bleeding from this lesion.  We did discuss that the goal would be to reduce bleeding risk overall.  She is quite hesitant to try any antihypertensives, but is willing to consider this.  Will initially start her on low-dose amlodipine, and I encouraged her to let me know if she feels unwell on this medication.  She tells me she will.  She was also encouraged to let me know if she does not hear back from the vascular surgeon within the next 2 weeks to schedule an appointment regarding her left carotid artery stenosis.  I encouraged her to continue trying to follow a healthy lifestyle which includes focusing on mainly whole foods and fresh vegetables/fruits. 3.  Originally I was going to check her vitamin D serum level, however because she is not taking it consistently I will hold off on checking blood work.  I recommended she take her vitamin D3 supplement (2000 IUs by mouth) daily.  She tells me she will try to do this.   Tests ordered No orders of the defined types were placed in this encounter.     Meds ordered this encounter  Medications  . amLODipine (NORVASC) 2.5 MG tablet    Sig: Take 1 tablet (2.5 mg total) by mouth daily.    Dispense:  90 tablet    Refill:  1    Order Specific Question:   Supervising Provider    Answer:   Doree Albee [[id number]]    Patient to follow-up in 3 months or sooner as needed.  Ailene Ards, NP

## 2020-10-03 NOTE — Progress Notes (Signed)
Melanie Howard, order placed for referral to vascular surgeon for mild left carotid artery stenosis  Melanie Howard, please call this patient and let her know that the ultrasound of the neck did show a mild to moderate stenosis (narrowing) due to a plaque in her left carotid artery. For further assistance with treatment and management of this narrowing I have ordered referral to a vascular surgeon. She should hear from their office within the next two weeks to schedule and appointment with them.

## 2020-10-04 DIAGNOSIS — Q283 Other malformations of cerebral vessels: Secondary | ICD-10-CM | POA: Diagnosis not present

## 2020-10-04 DIAGNOSIS — R030 Elevated blood-pressure reading, without diagnosis of hypertension: Secondary | ICD-10-CM | POA: Diagnosis not present

## 2020-10-07 ENCOUNTER — Ambulatory Visit: Payer: 59 | Admitting: Vascular Surgery

## 2020-10-07 ENCOUNTER — Other Ambulatory Visit: Payer: Self-pay

## 2020-10-07 ENCOUNTER — Encounter: Payer: Self-pay | Admitting: Vascular Surgery

## 2020-10-07 VITALS — BP 138/59 | HR 65 | Temp 97.1°F | Resp 14 | Ht 62.0 in | Wt 132.0 lb

## 2020-10-07 DIAGNOSIS — I6522 Occlusion and stenosis of left carotid artery: Secondary | ICD-10-CM

## 2020-10-07 NOTE — Progress Notes (Signed)
[facility] [place]  Patient name: Melanie Howard MRN: [id number] DOB: [birth year] Sex: female  REASON FOR CONSULT: Evaluation left carotid stenosis  HPI: JADALYNN Howard is a 59 y.o. female, here today for evaluation of left carotid stenosis.  Patient is well-known to me.  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  She was having difficulty with daily left frontal headaches and underwent MRI and carotid duplex evaluation.  MRI showed a 5 x 7 mm lesion in the right head of the caudate.  Some evidence of prior hemorrhage.  She has seen neurosurgery for evaluation of this and the plan is for observation.  She was found to have moderate left internal carotid artery stenosis and is here for discussion of this.  She has never smoked.  She does have mild hypertension  History reviewed. No pertinent past medical history.  Family History  Problem Relation Age of Onset  . Hypertension Mother   . Hypertension Father   . Heart disease Father   . Breast cancer Maternal Grandmother        Age unknown  . Depression Sister   . Restless legs syndrome Sister   . Hypertension Brother     SOCIAL HISTORY: Social History   Socioeconomic History  . Marital status: Married    Spouse name: Not on file  . Number of children: Not on file  . Years of education: Not on file  . Highest education level: Not on file  Occupational History  . Not on file  Tobacco Use  . Smoking status: Never Smoker  . Smokeless tobacco: Never Used  Vaping Use  . Vaping Use: Never used  Substance and Sexual Activity  . Alcohol use: Yes    Comment: Rare; socially on weekends  . Drug use: No  . Sexual activity: Yes    Birth control/protection: Surgical    Comment: BTL-1st intercourse 59 yo-Fewer than 5 partners  Other Topics Concern  . Not on file  Social History Narrative  . Not on file   Social Determinants of Health   Financial Resource  Strain:   . Difficulty of Paying Living Expenses: Not on file  Food Insecurity:   . Worried About Charity fundraiser in the Last Year: Not on file  . Ran Out of Food in the Last Year: Not on file  Transportation Needs:   . Lack of Transportation (Medical): Not on file  . Lack of Transportation (Non-Medical): Not on file  Physical Activity:   . Days of Exercise per Week: Not on file  . Minutes of Exercise per Session: Not on file  Stress:   . Feeling of Stress : Not on file  Social Connections:   . Frequency of Communication with Friends and Family: Not on file  . Frequency of Social Gatherings with Friends and Family: Not on file  . Attends Religious Services: Not on file  . Active Member of Clubs or Organizations: Not on file  . Attends Archivist Meetings: Not on file  . Marital Status: Not on file  Intimate Partner Violence:   . Fear of Current or Ex-Partner: Not on file  . Emotionally Abused: Not on file  . Physically Abused: Not on file  . Sexually Abused: Not on file    No Known Allergies  Current Outpatient Medications  Medication Sig Dispense Refill  . Cholecalciferol 250 MCG (10000 UT) CAPS Take  2 capsules by mouth.    . Ibuprofen (ADVIL PO) Take by mouth.    Marland Kitchen amLODipine (NORVASC) 2.5 MG tablet Take 1 tablet (2.5 mg total) by mouth daily. (Patient not taking: Reported on 10/07/2020) 90 tablet 1   No current facility-administered medications for this visit.    REVIEW OF SYSTEMS:  [X]  denotes positive finding, [ ]  denotes negative finding Cardiac  Comments:  Chest pain or chest pressure:    Shortness of breath upon exertion:    Short of breath when lying flat:    Irregular heart rhythm:        Vascular    Pain in calf, thigh, or hip brought on by ambulation:    Pain in feet at night that wakes you up from your sleep:     Blood clot in your veins:    Leg swelling:         Pulmonary    Oxygen at home:    Productive cough:     Wheezing:           Neurologic    Sudden weakness in arms or legs:     Sudden numbness in arms or legs:     Sudden onset of difficulty speaking or slurred speech:    Temporary loss of vision in one eye:     Problems with dizziness:         Gastrointestinal    Blood in stool:     Vomited blood:         Genitourinary    Burning when urinating:     Blood in urine:        Psychiatric    Major depression:         Hematologic    Bleeding problems:    Problems with blood clotting too easily:        Skin    Rashes or ulcers:        Constitutional    Fever or chills:      PHYSICAL EXAM: Vitals:   10/07/20 1014  BP: (!) 138/59  Pulse: 65  Resp: 14  Temp: (!) 97.1 F (36.2 C)  TempSrc: Other (Comment)  SpO2: 95%  Weight: 132 lb (59.9 kg)  Height: 5\' 2"  (1.575 m)    GENERAL: The patient is a well-nourished female, in no acute distress. The vital signs are documented above. VASCULAR: I do not hear carotid bruits bilaterally.  She has 2+ radial pulses bilaterally PULMONARY: There is good air exchange ABDOMEN: Soft and non-tender  MUSCULOSKELETAL: There are no major deformities or cyanosis. NEUROLOGIC: No focal weakness or paresthesias are detected. SKIN: There are no ulcers or rashes noted. PSYCHIATRIC: The patient has a normal affect.  DATA:   Duplex reveals moderate 50 to 69% stenosis at the bifurcation on the left.  No stenosis on the right  MEDICAL ISSUES:  Asymptomatic moderate left internal carotid artery stenosis.  I reviewed symptoms with the patient.  I do not feel that this has any bearing on her headache.  I have recommended repeat duplex in 1 year for continued follow-up.  Described symptoms of carotid disease and she knows to seek medical attention immediately should this occur   Rosetta Posner, MD [facility] [place] Office phone [phone]

## 2021-01-09 ENCOUNTER — Other Ambulatory Visit: Payer: Self-pay

## 2021-01-09 ENCOUNTER — Ambulatory Visit (INDEPENDENT_AMBULATORY_CARE_PROVIDER_SITE_OTHER): Payer: 59 | Admitting: Internal Medicine

## 2021-01-09 ENCOUNTER — Encounter (INDEPENDENT_AMBULATORY_CARE_PROVIDER_SITE_OTHER): Payer: Self-pay | Admitting: Internal Medicine

## 2021-01-09 ENCOUNTER — Encounter (INDEPENDENT_AMBULATORY_CARE_PROVIDER_SITE_OTHER): Payer: Self-pay | Admitting: *Deleted

## 2021-01-09 VITALS — BP 116/74 | HR 66 | Temp 98.4°F | Ht 62.0 in | Wt 132.0 lb

## 2021-01-09 DIAGNOSIS — Z1211 Encounter for screening for malignant neoplasm of colon: Secondary | ICD-10-CM

## 2021-01-09 DIAGNOSIS — I6522 Occlusion and stenosis of left carotid artery: Secondary | ICD-10-CM | POA: Diagnosis not present

## 2021-01-09 DIAGNOSIS — E559 Vitamin D deficiency, unspecified: Secondary | ICD-10-CM

## 2021-01-09 DIAGNOSIS — E785 Hyperlipidemia, unspecified: Secondary | ICD-10-CM | POA: Diagnosis not present

## 2021-01-09 DIAGNOSIS — I10 Essential (primary) hypertension: Secondary | ICD-10-CM | POA: Diagnosis not present

## 2021-01-09 NOTE — Progress Notes (Signed)
Metrics: Intervention Frequency ACO  Documented Smoking Status Yearly  Screened one or more times in 24 months  Cessation Counseling or  Active cessation medication Past 24 months  Past 24 months   Guideline developer: UpToDate (See UpToDate for funding source) Date Released: 2014       Wellness Office Visit  Subjective:  Patient ID: Melanie Howard, female    DOB: [birth year]  Age: 60 y.o. MRN: [id number]  CC: This lady comes in for follow-up regarding her hypertension, headaches and abnormality on MRI scan. HPI  She also has a history of vitamin D deficiency which is quite severe and she is taking vitamin D3 I believe 2000 units daily. She has dyslipidemia and she also has left carotid artery stenosis, not enough to require surgery at this point.  Fortunately, she does not have any cardiac symptoms at the present time. She has seen a neurosurgeon who does not have further recommendations except to repeat the scan in about 6 months of her brain. History reviewed. No pertinent past medical history. Past Surgical History:  Procedure Laterality Date  . COLPOSCOPY    . GYNECOLOGIC CRYOSURGERY  college   paps normal since  . TUBAL LIGATION       Family History  Problem Relation Age of Onset  . Hypertension Mother   . Hypertension Father   . Heart disease Father   . Breast cancer Maternal Grandmother        Age unknown  . Depression Sister   . Restless legs syndrome Sister   . 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 Smoking status: Never Smoker  . Smokeless tobacco: Never Used  Substance Use Topics  . Alcohol use: Yes    Alcohol/week: 4.0 standard drinks    Types: 2 Glasses of wine, 2 Cans of beer per week    Comment: Rare; socially on weekends    Current Meds  Medication Sig  . amLODipine (NORVASC) 2.5 MG tablet  Take 1 tablet (2.5 mg total) by mouth daily.  . Cholecalciferol 250 MCG (10000 UT) CAPS Take 2 capsules by mouth.  . Ibuprofen (ADVIL PO) Take by mouth.  . loratadine (CLARITIN) 10 MG tablet Take 10 mg by mouth as needed for allergies.      Depression screen [facility] 2/9 01/09/2021  Decreased Interest 0  Down, Depressed, Hopeless 0  PHQ - 2 Score 0  Altered sleeping 0  Tired, decreased energy 0  Change in appetite 0  Feeling bad or failure about yourself  0  Trouble concentrating 0  Moving slowly or fidgety/restless 0  Suicidal thoughts 0  PHQ-9 Score 0  Difficult doing work/chores Not difficult at all     Objective:   Today's Vitals: BP 116/74   Pulse 66   Temp 98.4 F (36.9 C) (Temporal)   Ht 5\' 2"  (1.575 m)   Wt 132 lb (59.9 kg)   LMP 01/08/2014   SpO2 98%   BMI 24.14 kg/m  Vitals with BMI 01/09/2021 10/07/2020 10/03/2020  Height 5\' 2"  5\' 2"  5\' 2"   Weight 132 lbs 132 lbs 132 lbs 6 oz  BMI 24.14 93.79 02.40  Systolic 973 532 992  Diastolic 74 59 88  Pulse 66 65 62     Physical Exam   She looks systemically well.  Healthy weight.  Blood  pressure is excellent control.    Assessment   1. Colon cancer screening   2. Hypertension, unspecified type   3. Left carotid artery stenosis   4. Hyperlipidemia, unspecified hyperlipidemia type   5. Vitamin D deficiency       Tests ordered Orders Placed This Encounter  Procedures  . Cardio IQ Adv Lipid and Inflamm Pnl  . T3, free  . T4, free  . TSH  . Ambulatory referral to Gastroenterology     Plan: 1. I recommended that she start taking vitamin D3 10,000 units daily to optimize vitamin D levels. 2. I am been to get a cardio IQ lipid panel because we need to focus on LDL particle number and her risk of coronary artery disease as I am concerned that she may have coronary artery disease already in view of the fact that she has left carotid artery plaque. 3. We will repeat thyroid function tests as she has some  symptoms which may be consistent with thyroid hypofunction. 4. Referred to gastroenterology for colonoscopy. 5. I will see her in about a month's time to review all her results and speak more about nutrition, exercise and possibly bioidentical hormone therapy if she is interested.  I have told her that we need to be aggressive to reduce risks because she already has evidence of plaque in her carotid artery. 6. Today I spent 35 minutes with this patient discussing in detail all her results and the philosophy of the practice   No orders of the defined types were placed in this encounter.   Doree Albee, MD

## 2021-01-10 LAB — TSH: TSH: 1.83 mIU/L (ref 0.40–4.50)

## 2021-01-10 LAB — T4, FREE: Free T4: 1.1 ng/dL (ref 0.8–1.8)

## 2021-01-10 LAB — T3, FREE: T3, Free: 3.3 pg/mL (ref 2.3–4.2)

## 2021-01-12 LAB — CARDIO IQ ADV LIPID AND INFLAMM PNL
Apolipoprotein B: 99 mg/dL — ABNORMAL HIGH (ref <90)
Cholesterol: 207 mg/dL — ABNORMAL HIGH (ref <200)
HDL: 59 mg/dL (ref >49)
LDL Cholesterol (Calc): 132 mg/dL (calc) — ABNORMAL HIGH (ref <100)
LDL Large: 4486 nmol/L — ABNORMAL LOW (ref >6729)
LDL Medium: 345 nmol/L — ABNORMAL HIGH (ref <215)
LDL Particle Number: 1605 nmol/L — ABNORMAL HIGH (ref <1138)
LDL Peak Size: 223 Angstrom (ref >222.9)
LDL Small: 216 nmol/L — ABNORMAL HIGH (ref <142)
Lipoprotein (a): <10 nmol/L (ref <75)
Non-HDL Cholesterol (Calc): 148 mg/dL (calc) — ABNORMAL HIGH (ref <130)
PLAC: 105 nmol/min/mL (ref <124)
Total CHOL/HDL Ratio: 3.5 calc (ref <3.6)
Triglycerides: 69 mg/dL (ref <150)
hs-CRP: 1.4 mg/L — ABNORMAL HIGH (ref <1.0)

## 2021-02-12 ENCOUNTER — Other Ambulatory Visit: Payer: Self-pay

## 2021-02-12 ENCOUNTER — Encounter (INDEPENDENT_AMBULATORY_CARE_PROVIDER_SITE_OTHER): Payer: Self-pay | Admitting: Internal Medicine

## 2021-02-12 ENCOUNTER — Other Ambulatory Visit (INDEPENDENT_AMBULATORY_CARE_PROVIDER_SITE_OTHER): Payer: Self-pay | Admitting: Internal Medicine

## 2021-02-12 ENCOUNTER — Ambulatory Visit (INDEPENDENT_AMBULATORY_CARE_PROVIDER_SITE_OTHER): Payer: 59 | Admitting: Internal Medicine

## 2021-02-12 VITALS — BP 124/82 | HR 67 | Temp 97.5°F | Ht 62.0 in | Wt 133.2 lb

## 2021-02-12 DIAGNOSIS — E559 Vitamin D deficiency, unspecified: Secondary | ICD-10-CM

## 2021-02-12 DIAGNOSIS — I10 Essential (primary) hypertension: Secondary | ICD-10-CM

## 2021-02-12 DIAGNOSIS — E785 Hyperlipidemia, unspecified: Secondary | ICD-10-CM | POA: Diagnosis not present

## 2021-02-12 DIAGNOSIS — I6522 Occlusion and stenosis of left carotid artery: Secondary | ICD-10-CM | POA: Diagnosis not present

## 2021-02-12 DIAGNOSIS — E2839 Other primary ovarian failure: Secondary | ICD-10-CM | POA: Diagnosis not present

## 2021-02-12 MED ORDER — AMLODIPINE BESYLATE 2.5 MG PO TABS: 2.5000 mg | ORAL_TABLET | Freq: Every day | ORAL | 1 refills | Status: DC

## 2021-02-12 MED ORDER — ESTRADIOL 0.5 MG PO TABS
0.5000 mg | ORAL_TABLET | Freq: Every day | ORAL | 3 refills | Status: DC
Start: 2021-02-12 — End: 2021-02-12

## 2021-02-12 MED ORDER — PROGESTERONE MICRONIZED 100 MG PO CAPS: 100.0000 mg | ORAL_CAPSULE | Freq: Every day | ORAL | 3 refills | Status: DC

## 2021-02-12 MED FILL — PROGESTERONE 100 MG CAPSULE: 100 | 30 days supply | Qty: 30 | Fill #0

## 2021-02-12 MED FILL — AMLODIPINE 2.5 MG TABLET: 2.5 | 90 days supply | Qty: 90 | Fill #0

## 2021-02-12 MED FILL — ESTRADIOL 0.5 MG TABS: 0.5 | 30 days supply | Qty: 30 | Fill #0

## 2021-02-12 NOTE — Progress Notes (Signed)
Metrics: Intervention Frequency ACO  Documented Smoking Status Yearly  Screened one or more times in 24 months  Cessation Counseling or  Active cessation medication Past 24 months  Past 24 months   Guideline developer: UpToDate (See UpToDate for funding source) Date Released: 2014       Wellness Office Visit  Subjective:  Patient ID: Melanie Howard, female    DOB: Dec 28, 1960  Age: 60 y.o. MRN: [id number]  CC: This lady comes in to discuss recent blood work and further recommendations. HPI  She is known to have left carotid artery stenosis which is not significant enough for surgery.  On the last visit, she had a cardio IQ lipid panel done as well as thyroid function.  We discussed these results in detail and of note, she has elevated LDL particle number which puts her at high risk for  coronary artery disease.  Thankfully, she is not having any cardiac symptoms at the present time. She is also menopausal for the last 7 years or so.  She denies any menopausal symptoms at the present time. History reviewed. No pertinent past medical history. Past Surgical History:  Procedure Laterality Date  . COLPOSCOPY    . GYNECOLOGIC CRYOSURGERY  college   paps normal since  . TUBAL LIGATION       Family History  Problem Relation Age of Onset  . Hypertension Mother   . Hypertension Father   . Heart disease Father   . Breast cancer Maternal Grandmother        Age unknown  . Depression Sister   . Restless legs syndrome Sister   . 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 Smoking status: Never Smoker  . Smokeless tobacco: Never Used  Substance Use Topics  . Alcohol use: Yes    Alcohol/week: 4.0 standard drinks    Types: 2 Glasses of wine, 2 Cans of beer per week    Comment: Rare; socially on weekends    Current Meds  Medication Sig   . acetaminophen (TYLENOL) 500 MG tablet Take by mouth.  . Cholecalciferol 250 MCG (10000 UT) CAPS Take 2 capsules by mouth.  . estradiol (ESTRACE) 0.5 MG tablet Take 1 tablet (0.5 mg total) by mouth daily.  . Ibuprofen (ADVIL PO) Take by mouth as needed.  . loratadine (CLARITIN) 10 MG tablet Take 10 mg by mouth as needed for allergies.  . progesterone (PROMETRIUM) 100 MG capsule Take 1 capsule (100 mg total) by mouth daily.  . [DISCONTINUED] amLODipine (NORVASC) 2.5 MG tablet Take 1 tablet (2.5 mg total) by mouth daily.     [place] Office Visit from 01/09/2021 in [facility]  PHQ-9 Total Score 0      Objective:   Today's Vitals: BP 124/82   Pulse 67   Temp (!) 97.5 F (36.4 C) (Temporal)   Ht 5\' 2"  (1.575 m)   Wt 133 lb 3.2 oz (60.4 kg)   LMP 01/08/2014   SpO2 97%   BMI 24.36 kg/m  Vitals with BMI 02/12/2021 01/09/2021 10/07/2020  Height 5\' 2"  5\' 2"  5\' 2"   Weight 133 lbs 3 oz 132 lbs 132 lbs  BMI 24.36 19.50 93.26  Systolic 712 458 099  Diastolic 82 74 59  Pulse 67 66 65     Physical Exam  She looks systemically  well.  No new physical findings.     Assessment   1. Left carotid artery stenosis   2. Hyperlipidemia, unspecified hyperlipidemia type   3. Vitamin D deficiency   4. Hypertension, unspecified type   5. Primary ovarian failure       Tests ordered Orders Placed This Encounter  Procedures  . DHEA-sulfate  . Estradiol  . Progesterone  . Testos,Total,Free and SHBG (Female)     Plan: 1. I discussed her results in detail. 2. We discussed again a plant-based diet and she will try to do this although she may find it difficult. 3. We discussed bioidentical hormone therapy in terms of health benefits and reducing the risk of coronary artery disease and cerebrovascular disease.  She is willing to try this and I am going to start her on estradiol and progesterone and we will check baseline levels.  I explained to her the benefits and side  effects of estradiol and progesterone and she will let me know if she has any problems.  We will also check DHEA and testosterone levels. 4. She does not appear to have any significant symptoms of thyroid hypofunction at the present time but we may address this at a later date. 5. Follow-up with me in the next month or so to see how she is doing.  I spent approximately 30 to 35 minutes with the patient discussing about  hormone therapy and the difference between synthetic and bioidentical hormones.   Meds ordered this encounter  Medications  . amLODipine (NORVASC) 2.5 MG tablet    Sig: Take 1 tablet (2.5 mg total) by mouth daily.    Dispense:  90 tablet    Refill:  1  . estradiol (ESTRACE) 0.5 MG tablet    Sig: Take 1 tablet (0.5 mg total) by mouth daily.    Dispense:  30 tablet    Refill:  3  . progesterone (PROMETRIUM) 100 MG capsule    Sig: Take 1 capsule (100 mg total) by mouth daily.    Dispense:  30 capsule    Refill:  3    Deivi Huckins Luther Parody, MD

## 2021-02-13 ENCOUNTER — Telehealth (INDEPENDENT_AMBULATORY_CARE_PROVIDER_SITE_OTHER): Payer: Self-pay

## 2021-02-13 ENCOUNTER — Other Ambulatory Visit (INDEPENDENT_AMBULATORY_CARE_PROVIDER_SITE_OTHER): Payer: Self-pay | Admitting: Internal Medicine

## 2021-02-13 DIAGNOSIS — R079 Chest pain, unspecified: Secondary | ICD-10-CM

## 2021-02-13 DIAGNOSIS — I6522 Occlusion and stenosis of left carotid artery: Secondary | ICD-10-CM

## 2021-02-13 DIAGNOSIS — I10 Essential (primary) hypertension: Secondary | ICD-10-CM

## 2021-02-13 DIAGNOSIS — G4452 New daily persistent headache (NDPH): Secondary | ICD-10-CM

## 2021-02-13 NOTE — Telephone Encounter (Signed)
Pt called thinking back on office visit from yesterday. We will need to send her to Dr Stanford Breed. She had some testing done awhile back. And seen her labs ar elevated. Just wants to get his opinion . Don't want to have a heart attack .

## 2021-02-16 LAB — PROGESTERONE: Progesterone: 0.5 ng/mL

## 2021-02-16 LAB — TESTOS,TOTAL,FREE AND SHBG (FEMALE)
Free Testosterone: 4.1 pg/mL (ref 0.1–6.4)
Sex Hormone Binding: 31 nmol/L (ref 14–73)
Testosterone, Total, LC-MS-MS: 29 ng/dL (ref 2–45)

## 2021-02-16 LAB — ESTRADIOL: Estradiol: 31 pg/mL

## 2021-02-16 LAB — DHEA-SULFATE: DHEA-SO4: 160 ug/dL (ref 5–167)

## 2021-03-12 NOTE — Progress Notes (Signed)
Referring-Nimish Gosrani MD Reason for referral-hypertension and carotid artery disease  HPI: 60 year old female for evaluation of carotid artery disease and hypertension at request of Hurshel Party MD. Carotid Dopplers October 2021 showed 50 to 69% stenosis on the left.  Patient denies dyspnea, chest pain, palpitations or syncope.  She had noticed that her blood pressure was elevated in the fall.  She was placed on amlodipine with improvement.  She also occasionally had a sharp pain in her left neck.  She had carotid Dopplers as outlined above.  Cardiology now asked to evaluate.  Current Outpatient Medications  Medication Sig Dispense Refill  . acetaminophen (TYLENOL) 500 MG tablet Take by mouth.    Marland Kitchen amLODipine (NORVASC) 2.5 MG tablet TAKE 1 TABLET (2.5 MG TOTAL) BY MOUTH DAILY. 90 tablet 1  . Cholecalciferol 250 MCG (10000 UT) CAPS Take 2 capsules by mouth.    . estradiol (ESTRACE) 1 MG tablet Take 1 tablet (1 mg total) by mouth daily. 30 tablet 3  . Ibuprofen (ADVIL PO) Take by mouth as needed.    . progesterone (PROMETRIUM) 200 MG capsule Take 1 capsule (200 mg total) by mouth daily. 30 capsule 3   No current facility-administered medications for this visit.    No Known Allergies   Past Medical History:  Diagnosis Date  . Carotid artery disease ([place])   . COVID   . Hyperlipidemia   . Hypertension     Past Surgical History:  Procedure Laterality Date  . COLPOSCOPY    . GYNECOLOGIC CRYOSURGERY  college   paps normal since  . TUBAL LIGATION      Social History   Socioeconomic History  . Marital status: Married    Spouse name: Not on file  . Number of children: 3  . Years of education: Not on file  . Highest education level: Not on file  Occupational History  . Not on file  Tobacco Use  . Smoking status: Never Smoker  . Smokeless tobacco: Never Used  Vaping Use  . Vaping Use: Never used  Substance and Sexual Activity  . Alcohol use: Yes    Alcohol/week: 4.0  standard drinks    Types: 2 Glasses of wine, 2 Cans of beer per week    Comment: Rare; socially on weekends  . Drug use: No  . Sexual activity: Yes    Birth control/protection: Surgical    Comment: BTL-1st intercourse 60 yo-Fewer than 5 partners  Other Topics Concern  . 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 Hypertension Mother   . Hypertension Father   . Heart disease Father   . Breast cancer Maternal Grandmother        Age unknown  . Depression Sister   . Restless legs syndrome Sister   . Hypertension Brother     ROS: no fevers or chills, productive cough, hemoptysis, dysphasia, odynophagia, melena, hematochezia, dysuria, hematuria, rash, seizure activity, orthopnea, PND, pedal edema, claudication. Remaining systems are negative.  Physical Exam:   Blood pressure 124/78, pulse 61, height 5\' 2"  (1.575  m), weight 134 lb (60.8 kg), last menstrual period 01/08/2014, SpO2 99 %.  General:  Well developed/well nourished in NAD Skin warm/dry Patient not depressed No peripheral clubbing Back-normal HEENT-normal/normal eyelids Neck supple/normal carotid upstroke bilaterally; no bruits; no JVD; no thyromegaly chest - CTA/ normal expansion CV - RRR/normal S1 and S2; no murmurs, rubs or gallops;  PMI nondisplaced Abdomen -NT/ND, no HSM, no mass, + bowel sounds, no bruit 2+ femoral pulses, no bruits Ext-no edema, chords, 2+ DP Neuro-grossly nonfocal  ECG -normal sinus rhythm at a rate of 61, normal axis, no ST changes.  Personally reviewed  A/P  1 carotid artery disease-plan to  treat with statin.  She will need follow-up carotid Dopplers October 2022.  2 hypertension-blood pressure is controlled.  Continue amlodipine at 2.5 mg daily.  Follow blood pressure and adjust regimen as needed.  3 hyperlipidemia-most recent LDL not at goal.  Also with carotid artery disease.  We will add Crestor 40 mg daily.  Check lipids and liver in 12 weeks.  4 family history of coronary artery disease-patient's father had coronary artery bypass and graft had 12.  We will arrange a calcium score for risk stratification.  Kirk Ruths, MD

## 2021-03-13 ENCOUNTER — Other Ambulatory Visit: Payer: Self-pay

## 2021-03-13 ENCOUNTER — Encounter (INDEPENDENT_AMBULATORY_CARE_PROVIDER_SITE_OTHER): Payer: Self-pay | Admitting: Internal Medicine

## 2021-03-13 ENCOUNTER — Ambulatory Visit (INDEPENDENT_AMBULATORY_CARE_PROVIDER_SITE_OTHER): Payer: 59 | Admitting: Internal Medicine

## 2021-03-13 VITALS — BP 120/70 | HR 72 | Temp 97.1°F | Resp 18 | Ht 62.0 in | Wt 133.0 lb

## 2021-03-13 DIAGNOSIS — E785 Hyperlipidemia, unspecified: Secondary | ICD-10-CM

## 2021-03-13 DIAGNOSIS — E2839 Other primary ovarian failure: Secondary | ICD-10-CM

## 2021-03-13 NOTE — Progress Notes (Signed)
Metrics: Intervention Frequency ACO  Documented Smoking Status Yearly  Screened one or more times in 24 months  Cessation Counseling or  Active cessation medication Past 24 months  Past 24 months   Guideline developer: UpToDate (See UpToDate for funding source) Date Released: 2014       Wellness Office Visit  Subjective:  Patient ID: Melanie Howard, female    DOB: Apr 29, 1961  Age: 60 y.o. MRN: [id number]  CC: This lady comes in for follow-up of bioidentical hormone therapy. HPI  She has tolerated low doses of estradiol and progesterone without any significant side effects.  She is also waiting to change her diet although this is a challenging venture. I discussed all the blood work with her which shows of course low estradiol and progesterone levels but also suboptimal testosterone levels.  DHEA level is in the normal range. History reviewed. No pertinent past medical history. Past Surgical History:  Procedure Laterality Date  . COLPOSCOPY    . GYNECOLOGIC CRYOSURGERY  college   paps normal since  . TUBAL LIGATION       Family History  Problem Relation Age of Onset  . Hypertension Mother   . Hypertension Father   . Heart disease Father   . Breast cancer Maternal Grandmother        Age unknown  . Depression Sister   . Restless legs syndrome Sister   . 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 Smoking status: Never Smoker  . Smokeless tobacco: Never Used  Substance Use Topics  . Alcohol use: Yes    Alcohol/week: 4.0 standard drinks    Types: 2 Glasses of wine, 2 Cans of beer per week    Comment: Rare; socially on weekends    Current Meds  Medication Sig  . acetaminophen (TYLENOL) 500 MG tablet Take by mouth.  Marland Kitchen amLODipine (NORVASC) 2.5 MG tablet TAKE 1 TABLET (2.5 MG TOTAL) BY MOUTH DAILY.  Marland Kitchen Cholecalciferol 250  MCG (10000 UT) CAPS Take 2 capsules by mouth.  . estradiol (ESTRACE) 0.5 MG tablet TAKE 1 TABLET (0.5 MG TOTAL) BY MOUTH DAILY.  Marland Kitchen Ibuprofen (ADVIL PO) Take by mouth as needed.  . loratadine (CLARITIN) 10 MG tablet Take 10 mg by mouth as needed for allergies.  . progesterone (PROMETRIUM) 100 MG capsule TAKE 1 CAPSULE (100 MG TOTAL) BY MOUTH DAILY.     [place] Office Visit from 01/09/2021 in [facility]  PHQ-9 Total Score 0      Objective:   Today's Vitals: BP 120/70 (BP Location: Right Arm, Patient Position: Sitting, Cuff Size: Normal)   Pulse 72   Temp (!) 97.1 F (36.2 C) (Temporal)   Resp 18   Ht 5\' 2"  (1.575 m)   Wt 133 lb (60.3 kg)   LMP 01/08/2014   SpO2 98%   BMI 24.33 kg/m  Vitals with BMI 03/13/2021 02/12/2021 01/09/2021  Height 5\' 2"  5\' 2"  5\' 2"   Weight 133 lbs 133 lbs 3 oz 132 lbs  BMI 24.32 24.58 09.98  Systolic 338 250 539  Diastolic 70 82 74  Pulse 72 67 66     Physical Exam  She looks systemically well.  Weight is stable.  Blood pressure is excellent.     Assessment   1. Hyperlipidemia, unspecified hyperlipidemia type   2. Primary ovarian  failure       Tests ordered Orders Placed This Encounter  Procedures  . Estradiol  . Progesterone     Plan: 1. She will continue with the same dose of estradiol and progesterone and we will check levels today.  We may need to adjust upwards based on the levels. 2. Continue to focus on nutrition. 3. Follow-up in 6 weeks.   No orders of the defined types were placed in this encounter.   Doree Albee, MD

## 2021-03-14 LAB — ESTRADIOL: Estradiol: 54 pg/mL

## 2021-03-14 LAB — PROGESTERONE: Progesterone: 5.3 ng/mL

## 2021-03-18 ENCOUNTER — Other Ambulatory Visit (HOSPITAL_COMMUNITY): Payer: Self-pay

## 2021-03-18 ENCOUNTER — Other Ambulatory Visit (INDEPENDENT_AMBULATORY_CARE_PROVIDER_SITE_OTHER): Payer: Self-pay | Admitting: Internal Medicine

## 2021-03-18 MED ORDER — PROGESTERONE 200 MG PO CAPS
200.0000 mg | ORAL_CAPSULE | Freq: Every day | ORAL | 3 refills | Status: DC
Start: 2021-03-18 — End: 2021-05-14
  Filled 2021-03-18: qty 30, 30d supply, fill #0

## 2021-03-18 MED ORDER — ESTRADIOL 1 MG PO TABS
1.0000 mg | ORAL_TABLET | Freq: Every day | ORAL | 3 refills | Status: DC
Start: 2021-03-18 — End: 2021-05-14
  Filled 2021-03-18: qty 30, 30d supply, fill #0

## 2021-03-24 NOTE — Progress Notes (Signed)
Please call patient and let her know what I have recommended regarding her bloodwork,thanks.

## 2021-03-25 ENCOUNTER — Other Ambulatory Visit (HOSPITAL_COMMUNITY): Payer: Self-pay

## 2021-03-25 ENCOUNTER — Ambulatory Visit: Payer: 59 | Admitting: Cardiology

## 2021-03-25 ENCOUNTER — Encounter: Payer: Self-pay | Admitting: Cardiology

## 2021-03-25 ENCOUNTER — Other Ambulatory Visit: Payer: Self-pay

## 2021-03-25 VITALS — BP 124/78 | HR 61 | Ht 62.0 in | Wt 134.0 lb

## 2021-03-25 DIAGNOSIS — I10 Essential (primary) hypertension: Secondary | ICD-10-CM | POA: Diagnosis not present

## 2021-03-25 DIAGNOSIS — I6522 Occlusion and stenosis of left carotid artery: Secondary | ICD-10-CM | POA: Diagnosis not present

## 2021-03-25 DIAGNOSIS — E7800 Pure hypercholesterolemia, unspecified: Secondary | ICD-10-CM

## 2021-03-25 MED ORDER — ROSUVASTATIN CALCIUM 40 MG PO TABS
40.0000 mg | ORAL_TABLET | Freq: Every day | ORAL | 3 refills | Status: DC
Start: 2021-03-25 — End: 2022-06-10
  Filled 2021-03-25: qty 90, 90d supply, fill #0
  Filled 2021-07-10: qty 90, 90d supply, fill #1
  Filled 2021-09-18: qty 90, 90d supply, fill #2
  Filled 2022-02-04: qty 90, 90d supply, fill #3

## 2021-03-25 NOTE — Patient Instructions (Signed)
Medication Instructions:  Begin rosuvastatin  40mg  once daily.   *If you need a refill on your cardiac medications before your next appointment, please call your pharmacy*   Lab Work: Lipid and liver panel in 12 weeks, around July If you have labs (blood work) drawn today and your tests are completely normal, you will receive your results only by: Marland Kitchen MyChart Message (if you have MyChart) OR . A paper copy in the mail If you have any lab test that is abnormal or we need to change your treatment, we will call you to review the results.   Testing/Procedures: Dr. Stanford Breed has ordered a CT coronary calcium score. This test is done at [address]. This is $99 out of pocket.   Coronary CalciumScan A coronary calcium scan is an imaging test used to look for deposits of calcium and other fatty materials (plaques) in the inner lining of the blood vessels of the heart (coronary arteries). These deposits of calcium and plaques can partly clog and narrow the coronary arteries without producing any symptoms or warning signs. This puts a person at risk for a heart attack. This test can detect these deposits before symptoms develop. Tell a health care provider about:  Any allergies you have.  All medicines you are taking, including vitamins, herbs, eye drops, creams, and over-the-counter medicines.  Any problems you or family members have had with anesthetic medicines.  Any blood disorders you have.  Any surgeries you have had.  Any medical conditions you have.  Whether you are pregnant or may be pregnant. What are the risks? Generally, this is a safe procedure. However, problems may occur, including:  Harm to a pregnant woman and her unborn baby. This test involves the use of radiation. Radiation exposure can be dangerous to a pregnant woman and her unborn baby. If you are pregnant, you generally should not have this procedure done.  Slight increase in the risk of cancer.  This is because of the radiation involved in the test. What happens before the procedure? No preparation is needed for this procedure. What happens during the procedure?  You will undress and remove any jewelry around your neck or chest.  You will put on a hospital gown.  Sticky electrodes will be placed on your chest. The electrodes will be connected to an electrocardiogram (ECG) machine to record a tracing of the electrical activity of your heart.  A CT scanner will take pictures of your heart. During this time, you will be asked to lie still and hold your breath for 2-3 seconds while a picture of your heart is being taken. The procedure may vary among health care providers and hospitals. What happens after the procedure?  You can get dressed.  You can return to your normal activities.  It is up to you to get the results of your test. Ask your health care provider, or the department that is doing the test, when your results will be ready. Summary  A coronary calcium scan is an imaging test used to look for deposits of calcium and other fatty materials (plaques) in the inner lining of the blood vessels of the heart (coronary arteries).  Generally, this is a safe procedure. Tell your health care provider if you are pregnant or may be pregnant.  No preparation is needed for this procedure.  A CT scanner will take pictures of your heart.  You can return to your normal activities after the scan is done. This information  is not intended to replace advice given to you by your health care provider. Make sure you discuss any questions you have with your health care provider. Document Released: 05/21/2008 Document Revised: 10/12/2016 Document Reviewed: 10/12/2016 Elsevier Interactive Patient Education  2017 [employer].   To be scheduled for October. Your physician has requested that you have a carotid duplex. This test is an ultrasound of the carotid arteries in your neck. It looks at  blood flow through these arteries that supply the brain with blood. Allow one hour for this exam. There are no restrictions or special instructions.     Follow-Up: At [facility], you and your health needs are our priority.  As part of our continuing mission to provide you with exceptional heart care, we have created designated Provider Care Teams.  These Care Teams include your primary Cardiologist (physician) and Advanced Practice Providers (APPs -  Physician Assistants and Nurse Practitioners) who all work together to provide you with the care you need, when you need it.  We recommend signing up for the patient portal called "MyChart".  Sign up information is provided on this After Visit Summary.  MyChart is used to connect with patients for Virtual Visits (Telemedicine).  Patients are able to view lab/test results, encounter notes, upcoming appointments, etc.  Non-urgent messages can be sent to your provider as well.   To learn more about what you can do with MyChart, go to [email or web address].    Your next appointment:   12 month(s)  The format for your next appointment:   In Person  Provider:   Kirk Ruths, MD

## 2021-03-28 ENCOUNTER — Other Ambulatory Visit (HOSPITAL_COMMUNITY): Payer: Self-pay

## 2021-03-28 ENCOUNTER — Telehealth (INDEPENDENT_AMBULATORY_CARE_PROVIDER_SITE_OTHER): Payer: Self-pay

## 2021-03-28 ENCOUNTER — Other Ambulatory Visit (INDEPENDENT_AMBULATORY_CARE_PROVIDER_SITE_OTHER): Payer: Self-pay

## 2021-03-28 ENCOUNTER — Encounter (INDEPENDENT_AMBULATORY_CARE_PROVIDER_SITE_OTHER): Payer: Self-pay

## 2021-03-28 DIAGNOSIS — Z1211 Encounter for screening for malignant neoplasm of colon: Secondary | ICD-10-CM

## 2021-03-28 MED ORDER — SUPREP BOWEL PREP KIT 17.5-3.13-1.6 GM/177ML PO SOLN
1.0000 | ORAL | 0 refills | Status: DC
  Filled 2021-03-28: qty 354, 1d supply, fill #0

## 2021-03-28 NOTE — Telephone Encounter (Signed)
Melanie Howard, CMA  

## 2021-03-28 NOTE — Telephone Encounter (Signed)
Referring MD/PCP: Anastasio Champion  Procedure: Tcs  Reason/Indication:  Screening  Has patient had this procedure before?  no  If so, when, by whom and where?    Is there a family history of colon cancer?  yes  Who?  What age when diagnosed?  grandfather  Is patient diabetic? If yes, Type 1 or Type 2   no      Does patient have prosthetic heart valve or mechanical valve?  no  Do you have a pacemaker/defibrillator?  no  Has patient ever had endocarditis/atrial fibrillation? no  Have you had a stroke/heart attack last 6 mths? no  Does patient use oxygen? no  Has patient had joint replacement within last 12 months?  no  Is patient constipated or do they take laxatives? no  Does patient have a history of alcohol/drug use?  no  Is patient on blood thinner such as Coumadin, Plavix and/or Aspirin? no  Do you take medicine for weight loss?  no  For female patients,: do you still have your menstrual cycle? Unknown  Medications: Amlodipine 2.5mg  daily, vit D3 daily, Tylenol Prn  Allergies: nkda  Procedure date & time: 04/09/21 8:45

## 2021-03-28 NOTE — Telephone Encounter (Signed)
Ok to schedule.  Thanks,  Nadalee Neiswender Castaneda Mayorga, MD Gastroenterology and Hepatology [facility]  

## 2021-04-01 ENCOUNTER — Other Ambulatory Visit (HOSPITAL_COMMUNITY): Payer: Self-pay

## 2021-04-08 ENCOUNTER — Other Ambulatory Visit: Payer: Self-pay

## 2021-04-08 ENCOUNTER — Other Ambulatory Visit (HOSPITAL_COMMUNITY)
Admission: RE | Admit: 2021-04-08 | Discharge: 2021-04-08 | Disposition: A | Discharge status: Home / Self Care | Payer: 59 | Source: Ambulatory Visit | Attending: Gastroenterology | Admitting: Gastroenterology

## 2021-04-08 DIAGNOSIS — Z01812 Encounter for preprocedural laboratory examination: Secondary | ICD-10-CM | POA: Insufficient documentation

## 2021-04-08 DIAGNOSIS — Z20822 Contact with and (suspected) exposure to covid-19: Secondary | ICD-10-CM | POA: Diagnosis not present

## 2021-04-08 LAB — SARS CORONAVIRUS 2 (TAT 6-24 HRS): SARS Coronavirus 2: NEGATIVE

## 2021-04-09 ENCOUNTER — Ambulatory Visit (HOSPITAL_COMMUNITY): Payer: 59 | Admitting: Anesthesiology

## 2021-04-09 ENCOUNTER — Encounter (HOSPITAL_COMMUNITY): Payer: Self-pay | Admitting: Gastroenterology

## 2021-04-09 ENCOUNTER — Ambulatory Visit (HOSPITAL_COMMUNITY)
Admission: RE | Admit: 2021-04-09 | Discharge: 2021-04-09 | Disposition: A | Discharge status: Home / Self Care | Payer: 59 | Attending: Gastroenterology | Admitting: Gastroenterology

## 2021-04-09 ENCOUNTER — Other Ambulatory Visit: Payer: Self-pay

## 2021-04-09 ENCOUNTER — Encounter (HOSPITAL_COMMUNITY)
Admission: RE | Disposition: A | Discharge status: Home / Self Care | Payer: Self-pay | Source: Home / Self Care | Attending: Gastroenterology

## 2021-04-09 DIAGNOSIS — E785 Hyperlipidemia, unspecified: Secondary | ICD-10-CM | POA: Insufficient documentation

## 2021-04-09 DIAGNOSIS — I779 Disorder of arteries and arterioles, unspecified: Secondary | ICD-10-CM | POA: Insufficient documentation

## 2021-04-09 DIAGNOSIS — K5730 Diverticulosis of large intestine without perforation or abscess without bleeding: Secondary | ICD-10-CM | POA: Diagnosis not present

## 2021-04-09 DIAGNOSIS — Q438 Other specified congenital malformations of intestine: Secondary | ICD-10-CM

## 2021-04-09 DIAGNOSIS — K635 Polyp of colon: Secondary | ICD-10-CM | POA: Insufficient documentation

## 2021-04-09 DIAGNOSIS — Z8249 Family history of ischemic heart disease and other diseases of the circulatory system: Secondary | ICD-10-CM | POA: Insufficient documentation

## 2021-04-09 DIAGNOSIS — I10 Essential (primary) hypertension: Secondary | ICD-10-CM | POA: Diagnosis not present

## 2021-04-09 DIAGNOSIS — Z1211 Encounter for screening for malignant neoplasm of colon: Secondary | ICD-10-CM | POA: Diagnosis not present

## 2021-04-09 DIAGNOSIS — D120 Benign neoplasm of cecum: Secondary | ICD-10-CM

## 2021-04-09 DIAGNOSIS — Z79899 Other long term (current) drug therapy: Secondary | ICD-10-CM | POA: Diagnosis not present

## 2021-04-09 DIAGNOSIS — Z8616 Personal history of COVID-19: Secondary | ICD-10-CM | POA: Diagnosis not present

## 2021-04-09 DIAGNOSIS — I739 Peripheral vascular disease, unspecified: Secondary | ICD-10-CM | POA: Diagnosis not present

## 2021-04-09 HISTORY — PX: COLONOSCOPY WITH PROPOFOL: SHX5780

## 2021-04-09 HISTORY — PX: POLYPECTOMY: SHX149

## 2021-04-09 HISTORY — DX: Personal history of urinary calculi: Z87.442

## 2021-04-09 LAB — HM COLONOSCOPY

## 2021-04-09 SURGERY — COLONOSCOPY WITH PROPOFOL
Anesthesia: General

## 2021-04-09 MED ORDER — PROPOFOL 10 MG/ML IV BOLUS
INTRAVENOUS | Status: DC | PRN
  Administered 2021-04-09: 70 mg via INTRAVENOUS
  Administered 2021-04-09: 50 mg via INTRAVENOUS
  Administered 2021-04-09: 20 mg via INTRAVENOUS
  Administered 2021-04-09: 70 mg via INTRAVENOUS

## 2021-04-09 MED ORDER — STERILE WATER FOR IRRIGATION IR SOLN
Status: DC | PRN
  Administered 2021-04-09: 200 mL

## 2021-04-09 MED ORDER — PROPOFOL 500 MG/50ML IV EMUL
INTRAVENOUS | Status: DC | PRN
  Administered 2021-04-09: 150 ug/kg/min via INTRAVENOUS

## 2021-04-09 MED ORDER — PROPOFOL 10 MG/ML IV BOLUS
INTRAVENOUS | Status: AC
  Filled 2021-04-09: qty 60

## 2021-04-09 MED ORDER — LACTATED RINGERS IV SOLN
INTRAVENOUS | Status: DC
  Administered 2021-04-09: 1000 mL via INTRAVENOUS

## 2021-04-09 MED ORDER — LIDOCAINE HCL 1 % IJ SOLN
INTRAMUSCULAR | Status: DC | PRN
  Administered 2021-04-09: 50 mg via INTRADERMAL

## 2021-04-09 NOTE — H&P (Signed)
Melanie Howard is an 60 y.o. female.   Chief Complaint: Colorectal cancer screening HPI: 60 year old female with past medical history of hypertension, hyperlipidemia and carotid artery disease, coming for screening colonoscopy. The patient has never had a colonoscopy in the past.  The patient denies having any complaints such as melena, hematochezia, abdominal pain or distention, change in her bowel movement consistency or frequency, no changes in her weight recently.  No family history of colorectal cancer.   Past Medical History:  Diagnosis Date  . Carotid artery disease ([place])   . COVID   . History of kidney stones   . Hyperlipidemia   . Hypertension     Past Surgical History:  Procedure Laterality Date  . COLPOSCOPY    . GYNECOLOGIC CRYOSURGERY  college   paps normal since  . TUBAL LIGATION      Family History  Problem Relation Age of Onset  . Hypertension Mother   . Hypertension Father   . Heart disease Father   . Breast cancer Maternal Grandmother        Age unknown  . Depression Sister   . Restless legs syndrome Sister   . Hypertension Brother    Social History:  reports that she has never smoked. She has never used smokeless tobacco. She reports current alcohol use. She reports that she does not use drugs.  Allergies: No Known Allergies  Medications Prior to Admission  Medication Sig Dispense Refill  . acetaminophen (TYLENOL) 325 MG tablet Take 650 mg by mouth every 6 (six) hours as needed (for pain.).    Marland Kitchen amLODipine (NORVASC) 2.5 MG tablet TAKE 1 TABLET (2.5 MG TOTAL) BY MOUTH DAILY. (Patient taking differently: Take 2.5 mg by mouth in the morning.) 90 tablet 1  . Cholecalciferol 250 MCG (10000 UT) CAPS Take 10,000 Units by mouth in the morning.    Marland Kitchen estradiol (ESTRACE) 1 MG tablet Take 1 tablet (1 mg total) by mouth daily. 30 tablet 3  . ibuprofen (ADVIL) 200 MG tablet Take 200 mg by mouth every 8 (eight) hours as needed (for pain.).    . Na Sulfate-K  Sulfate-Mg Sulf (SUPREP BOWEL PREP KIT) 17.5-3.13-1.6 GM/177ML SOLN Take 1 kit by mouth as directed. 354 mL 0  . progesterone (PROMETRIUM) 200 MG capsule Take 1 capsule (200 mg total) by mouth daily. 30 capsule 3  . rosuvastatin (CRESTOR) 40 MG tablet Take 1 tablet (40 mg total) by mouth daily. (Patient taking differently: Take 40 mg by mouth daily with lunch.) 90 tablet 3  . estradiol (ESTRACE) 0.5 MG tablet Take 0.5 mg by mouth in the morning.    . Ibuprofen (ADVIL PO) Take by mouth as needed.    . progesterone (PROMETRIUM) 100 MG capsule Take 100 mg by mouth at bedtime.      Results for orders placed or performed during the hospital encounter of 04/08/21 (from the past 48 hour(s))  SARS CORONAVIRUS 2 (TAT 6-24 HRS) Nasopharyngeal Nasopharyngeal Swab     Status: None   Collection Time: 04/08/21  7:23 AM   Specimen: Nasopharyngeal Swab  Result Value Ref Range   SARS Coronavirus 2 NEGATIVE NEGATIVE    Comment: (NOTE) SARS-CoV-2 target nucleic acids are NOT DETECTED.  The SARS-CoV-2 RNA is generally detectable in upper and lower respiratory specimens during the acute phase of infection. Negative results do not preclude SARS-CoV-2 infection, do not rule out co-infections with other pathogens, and should not be used as the sole basis for treatment or other patient management  decisions. Negative results must be combined with clinical observations, patient history, and epidemiological information. The expected result is Negative.  Fact Sheet for Patients: SugarRoll.be  Fact Sheet for Healthcare Providers: https://www.woods-mathews.com/  This test is not yet approved or cleared by the [place] FDA and  has been authorized for detection and/or diagnosis of SARS-CoV-2 by FDA under an Emergency Use Authorization (EUA). This EUA will remain  in effect (meaning this test can be used) for the duration of the COVID-19 declaration under Se ction  564(b)(1) of the Act, 21 U.S.C. section 360bbb-3(b)(1), unless the authorization is terminated or revoked sooner.  Performed at [facility], [place] [address], [place], [place] [address]    No results found.  Review of Systems  Constitutional: Negative.   HENT: Negative.   Eyes: Negative.   Respiratory: Negative.   Cardiovascular: Negative.   Gastrointestinal: Negative.   Endocrine: Negative.   Genitourinary: Negative.   Musculoskeletal: Negative.   Skin: Negative.   Allergic/Immunologic: Negative.   Neurological: Negative.   Hematological: Negative.   Psychiatric/Behavioral: Negative.     Blood pressure 125/72, pulse 65, temperature 97.7 F (36.5 C), temperature source Oral, resp. rate 17, height '5\' 2"'  (1.575 m), weight 60.3 kg, last menstrual period 01/08/2014, SpO2 100 %. Physical Exam  GENERAL: The patient is AO x3, in no acute distress. HEENT: Head is normocephalic and atraumatic. EOMI are intact. Mouth is well hydrated and without lesions. NECK: Supple. No masses LUNGS: Clear to auscultation. No presence of rhonchi/wheezing/rales. Adequate chest expansion HEART: RRR, normal s1 and s2. ABDOMEN: Soft, nontender, no guarding, no peritoneal signs, and nondistended. BS +. No masses. EXTREMITIES: Without any cyanosis, clubbing, rash, lesions or edema. NEUROLOGIC: AOx3, no focal motor deficit. SKIN: no jaundice, no rashes  Assessment/Plan 60 year old female with past medical history of hypertension, hyperlipidemia and carotid artery disease, coming for screening colonoscopy. The patient is at average risk for colorectal cancer.  We will proceed with colonoscopy today.   Harvel Quale, MD 04/09/2021, 7:39 AM

## 2021-04-09 NOTE — Anesthesia Preprocedure Evaluation (Signed)
Anesthesia Evaluation  Patient identified by MRN, date of birth, ID band Patient awake    Reviewed: Allergy & Precautions, H&P , NPO status , Patient's Chart, lab work & pertinent test results, reviewed documented beta blocker date and time   Airway Mallampati: II  TM Distance: >3 FB Neck ROM: full    Dental no notable dental hx. (+) Teeth Intact   Pulmonary neg pulmonary ROS,    Pulmonary exam normal breath sounds clear to auscultation       Cardiovascular Exercise Tolerance: Good hypertension, + Peripheral Vascular Disease   Rhythm:regular Rate:Normal     Neuro/Psych  Headaches, negative psych ROS   GI/Hepatic negative GI ROS, Neg liver ROS,   Endo/Other  negative endocrine ROS  Renal/GU negative Renal ROS  negative genitourinary   Musculoskeletal   Abdominal   Peds  Hematology negative hematology ROS (+)   Anesthesia Other Findings Carotid artery disease (L) treated medically  Reproductive/Obstetrics negative OB ROS                             Anesthesia Physical Anesthesia Plan  ASA: II  Anesthesia Plan: General   Post-op Pain Management:    Induction:   PONV Risk Score and Plan: Propofol infusion  Airway Management Planned:   Additional Equipment:   Intra-op Plan:   Post-operative Plan:   Informed Consent: I have reviewed the patients History and Physical, chart, labs and discussed the procedure including the risks, benefits and alternatives for the proposed anesthesia with the patient or authorized representative who has indicated his/her understanding and acceptance.     Dental Advisory Given  Plan Discussed with: CRNA  Anesthesia Plan Comments:         Anesthesia Quick Evaluation

## 2021-04-09 NOTE — Op Note (Signed)
[facility] Patient Name: Melanie Howard Procedure Date: 04/09/2021 7:41 AM MRN: [id number] Date of Birth: 1961-04-24 Attending MD: Maylon Peppers ,  CSN: [id number] Age: 60 Admit Type: Outpatient Procedure:                Colonoscopy Indications:              Screening for colorectal malignant neoplasm Providers:                Maylon Peppers Referring MD:              Medicines:                Monitored Anesthesia Care Complications:            No immediate complications. Estimated Blood Loss:     Estimated blood loss: none. Procedure:                Pre-Anesthesia Assessment:                           - Prior to the procedure, a History and Physical                            was performed, and patient medications, allergies                            and sensitivities were reviewed. The patient's                            tolerance of previous anesthesia was reviewed.                           - The risks and benefits of the procedure and the                            sedation options and risks were discussed with the                            patient. All questions were answered and informed                            consent was obtained.                           - ASA Grade Assessment: II - A patient with mild                            systemic disease.                           After obtaining informed consent, the colonoscope                            was passed under direct vision. Throughout the                            procedure, the patient's blood pressure, pulse, and  oxygen saturations were monitored continuously.The                            colonoscopy was performed without difficulty. The                            patient tolerated the procedure well. The quality                            of the bowel preparation was good. The PCF-[id number]                            ([id number]) scope was introduced through the anus and                             advanced to the the cecum, identified by                            appendiceal orifice and ileocecal valve. Scope In: 8:27:39 AM Scope Out: 8:56:20 AM Scope Withdrawal Time: 0 hours 17 minutes 49 seconds  Total Procedure Duration: 0 hours 28 minutes 41 seconds  Findings:      The perianal and digital rectal examinations were normal.      The sigmoid colon was moderately tortuous.      A 5 mm polyp was found in the cecum. The polyp was sessile. The polyp       was removed with a hot snare. Resection and retrieval were complete.      A few small and large-mouthed diverticula were found in the sigmoid       colon, descending colon and ascending colon.      No retroflexion performed given tight anal canal, no lesions observed in       forward view. Impression:               - Tortuous colon.                           - One 5 mm polyp in the cecum, removed with a hot                            snare. Resected and retrieved.                           - Diverticulosis in the sigmoid colon, in the                            descending colon and in the ascending colon. Moderate Sedation:      Per Anesthesia Care Recommendation:           - Discharge patient to home (ambulatory).                           - Resume previous diet.                           - Repeat colonoscopy for surveillance based on  pathology results.                           - Await pathology results. Procedure Code(s):        --- Professional ---                           [phone], Colonoscopy, flexible; with removal of                            tumor(s), polyp(s), or other lesion(s) by snare                            technique Diagnosis Code(s):        --- Professional ---                           Z12.11, Encounter for screening for malignant                            neoplasm of colon                           K63.5, Polyp of colon                           K57.30,  Diverticulosis of large intestine without                            perforation or abscess without bleeding                           Q43.8, Other specified congenital malformations of                            intestine CPT copyright 2019 American [employer] Association. All rights reserved. The codes documented in this report are preliminary and upon coder review may  be revised to meet current compliance requirements. Maylon Peppers, MD Maylon Peppers,  04/09/2021 9:02:52 AM This report has been signed electronically. Number of Addenda: 0

## 2021-04-09 NOTE — Anesthesia Postprocedure Evaluation (Signed)
Anesthesia Post Note  Patient: Melanie Howard  Procedure(s) Performed: COLONOSCOPY WITH PROPOFOL (N/A ) POLYPECTOMY INTESTINAL  Patient location during evaluation: Endoscopy Anesthesia Type: General Level of consciousness: awake and alert Pain management: pain level controlled Vital Signs Assessment: post-procedure vital signs reviewed and stable Respiratory status: spontaneous breathing Cardiovascular status: blood pressure returned to baseline and stable Postop Assessment: no apparent nausea or vomiting Anesthetic complications: no   No complications documented.   Last Vitals:  Vitals:   04/09/21 0726 04/09/21 0904  BP: 125/72 93/80  Pulse: 65   Resp: 17 16  Temp: 36.5 C 36.6 C  SpO2: 100% 100%    Last Pain:  Vitals:   04/09/21 0904  TempSrc: Oral  PainSc: 0-No pain                 Yer Castello

## 2021-04-09 NOTE — Discharge Instructions (Addendum)
You are being discharged to home.  Resume your previous diet.  Your physician has recommended a repeat colonoscopy for surveillance based on pathology results.  We are waiting for your pathology results.     Colonoscopy, Adult, Care After This sheet gives you information about how to care for yourself after your procedure. Your doctor may also give you more specific instructions. If you have problems or questions, call your doctor. What can I expect after the procedure? After the procedure, it is common to have:  A small amount of blood in your poop (stool) for 24 hours.  Some gas.  Mild cramping or bloating in your belly (abdomen). Follow these instructions at home: Eating and drinking  Drink enough fluid to keep your pee (urine) pale yellow.  Follow instructions from your doctor about what you cannot eat or drink.  Return to your normal diet as told by your doctor. Avoid heavy or fried foods that are hard to digest.   Activity  Rest as told by your doctor.  Do not sit for a long time without moving. Get up to take short walks every 1-2 hours. This is important. Ask for help if you feel weak or unsteady.  Return to your normal activities as told by your doctor. Ask your doctor what activities are safe for you. To help cramping and bloating:  Try walking around.  Put heat on your belly as told by your doctor. Use the heat source that your doctor recommends, such as a moist heat pack or a heating pad. ? Put a towel between your skin and the heat source. ? Leave the heat on for 20-30 minutes. ? Remove the heat if your skin turns bright red. This is very important if you are unable to feel pain, heat, or cold. You may have a greater risk of getting burned.   General instructions  If you were given a medicine to help you relax (sedative) during your procedure, it can affect you for many hours. Do not drive or use machinery until your doctor says that it is safe.  For the first  24 hours after the procedure: ? Do not sign important documents. ? Do not drink alcohol. ? Do your daily activities more slowly than normal. ? Eat foods that are soft and easy to digest.  Take over-the-counter or prescription medicines only as told by your doctor.  Keep all follow-up visits as told by your doctor. This is important. Contact a doctor if:  You have blood in your poop 2-3 days after the procedure. Get help right away if:  You have more than a small amount of blood in your poop.  You see large clumps of tissue (blood clots) in your poop.  Your belly is swollen.  You feel like you may vomit (nauseous).  You vomit.  You have a fever.  You have belly pain that gets worse, and medicine does not help your pain. Summary  After the procedure, it is common to have a small amount of blood in your poop. You may also have mild cramping and bloating in your belly.  If you were given a medicine to help you relax (sedative) during your procedure, it can affect you for many hours. Do not drive or use machinery until your doctor says that it is safe.  Get help right away if you have a lot of blood in your poop, feel like you may vomit, have a fever, or have more belly pain. This information is  not intended to replace advice given to you by your health care provider. Make sure you discuss any questions you have with your health care provider. Document Revised: 09/29/2019 Document Reviewed: 06/19/2019 Elsevier Patient Education  [place].  Colon Polyps  Colon polyps are tissue growths inside the colon, which is part of the large intestine. They are one of the types of polyps that can grow in the body. A polyp may be a round bump or a mushroom-shaped growth. You could have one polyp or more than one. Most colon polyps are noncancerous (benign). However, some colon polyps can become cancerous over time. Finding and removing the polyps early can help prevent this. What are  the causes? The exact cause of colon polyps is not known. What increases the risk? The following factors may make you more likely to develop this condition:  Having a family history of colorectal cancer or colon polyps.  Being older than 59 years of age.  Being younger than 59 years of age and having a significant family history of colorectal cancer or colon polyps or a genetic condition that puts you at higher risk of getting colon polyps.  Having inflammatory bowel disease, such as ulcerative colitis or Crohn's disease.  Having certain conditions passed from parent to child (hereditary conditions), such as: ? Familial adenomatous polyposis (FAP). ? Lynch syndrome. ? Turcot syndrome. ? Peutz-Jeghers syndrome. ? MUTYH-associated polyposis (MAP).  Being overweight.  Certain lifestyle factors. These include smoking cigarettes, drinking too much alcohol, not getting enough exercise, and eating a diet that is high in fat and red meat and low in fiber.  Having had childhood cancer that was treated with radiation of the abdomen. What are the signs or symptoms? Many times, there are no symptoms. If you have symptoms, they may include:  Blood coming from the rectum during a bowel movement.  Blood in the stool (feces). The blood may be bright red or very dark in color.  Pain in the abdomen.  A change in bowel habits, such as constipation or diarrhea. How is this diagnosed? This condition is diagnosed with a colonoscopy. This is a procedure in which a lighted, flexible scope is inserted into the opening between the buttocks (anus) and then passed into the colon to examine the area. Polyps are sometimes found when a colonoscopy is done as part of routine cancer screening tests. How is this treated? This condition is treated by removing any polyps that are found. Most polyps can be removed during a colonoscopy. Those polyps will then be tested for cancer. Additional treatment may be needed  depending on the results of testing. Follow these instructions at home: Eating and drinking  Eat foods that are high in fiber, such as fruits, vegetables, and whole grains.  Eat foods that are high in calcium and vitamin D, such as milk, cheese, yogurt, eggs, liver, fish, and broccoli.  Limit foods that are high in fat, such as fried foods and desserts.  Limit the amount of red meat, precooked or cured meat, or other processed meat that you eat, such as hot dogs, sausages, bacon, or meat loaves.  Limit sugary drinks.   Lifestyle  Maintain a healthy weight, or lose weight if recommended by your health care provider.  Exercise every day or as told by your health care provider.  Do not use any products that contain nicotine or tobacco, such as cigarettes, e-cigarettes, and chewing tobacco. If you need help quitting, ask your health care provider.  Do not drink alcohol if: ? Your health care provider tells you not to drink. ? You are pregnant, may be pregnant, or are planning to become pregnant.  If you drink alcohol: ? Limit how much you use to:  0-1 drink a day for women.  0-2 drinks a day for men. ? Know how much alcohol is in your drink. In the [place] one drink equals one 12 oz bottle of beer (355 mL), one 5 oz glass of wine (148 mL), or one 1 oz glass of hard liquor (44 mL). General instructions  Take over-the-counter and prescription medicines only as told by your health care provider.  Keep all follow-up visits. This is important. This includes having regularly scheduled colonoscopies. Talk to your health care provider about when you need a colonoscopy. Contact a health care provider if:  You have new or worsening bleeding during a bowel movement.  You have new or increased blood in your stool.  You have a change in bowel habits.  You lose weight for no known reason. Summary  Colon polyps are tissue growths inside the colon, which is part of the large intestine.  They are one type of polyp that can grow in the body.  Most colon polyps are noncancerous (benign), but some can become cancerous over time.  This condition is diagnosed with a colonoscopy.  This condition is treated by removing any polyps that are found. Most polyps can be removed during a colonoscopy. This information is not intended to replace advice given to you by your health care provider. Make sure you discuss any questions you have with your health care provider. Document Revised: 03/13/2020 Document Reviewed: 03/13/2020 Elsevier Patient Education  2021 [place].    Diverticulosis  Diverticulosis is a condition that develops when small pouches (diverticula) form in the wall of the large intestine (colon). The colon is where water is absorbed and stool (feces) is formed. The pouches form when the inside layer of the colon pushes through weak spots in the outer layers of the colon. You may have a few pouches or many of them. The pouches usually do not cause problems unless they become inflamed or infected. When this happens, the condition is called diverticulitis. What are the causes? The cause of this condition is not known. What increases the risk? The following factors may make you more likely to develop this condition:  Being older than age 61. Your risk for this condition increases with age. Diverticulosis is rare among people younger than age 64. By age 23, many people have it.  Eating a low-fiber diet.  Having frequent constipation.  Being overweight.  Not getting enough exercise.  Smoking.  Taking over-the-counter pain medicines, like aspirin and ibuprofen.  Having a family history of diverticulosis. What are the signs or symptoms? In most people, there are no symptoms of this condition. If you do have symptoms, they may include:  Bloating.  Cramps in the abdomen.  Constipation or diarrhea.  Pain in the lower left side of the abdomen. How is this  diagnosed? Because diverticulosis usually has no symptoms, it is most often diagnosed during an exam for other colon problems. The condition may be diagnosed by:  Using a flexible scope to examine the colon (colonoscopy).  Taking an X-ray of the colon after dye has been put into the colon (barium enema).  Having a CT scan. How is this treated? You may not need treatment for this condition. Your health care provider may recommend treatment  to prevent problems. You may need treatment if you have symptoms or if you previously had diverticulitis. Treatment may include:  Eating a high-fiber diet.  Taking a fiber supplement.  Taking a live bacteria supplement (probiotic).  Taking medicine to relax your colon.   Follow these instructions at home: Medicines  Take over-the-counter and prescription medicines only as told by your health care provider.  If told by your health care provider, take a fiber supplement or probiotic. Constipation prevention Your condition may cause constipation. To prevent or treat constipation, you may need to:  Drink enough fluid to keep your urine pale yellow.  Take over-the-counter or prescription medicines.  Eat foods that are high in fiber, such as beans, whole grains, and fresh fruits and vegetables.  Limit foods that are high in fat and processed sugars, such as fried or sweet foods.   General instructions  Try not to strain when you have a bowel movement.  Keep all follow-up visits as told by your health care provider. This is important. Contact a health care provider if you:  Have pain in your abdomen.  Have bloating.  Have cramps.  Have not had a bowel movement in 3 days. Get help right away if:  Your pain gets worse.  Your bloating becomes very bad.  You have a fever or chills, and your symptoms suddenly get worse.  You vomit.  You have bowel movements that are bloody or black.  You have bleeding from your  rectum. Summary  Diverticulosis is a condition that develops when small pouches (diverticula) form in the wall of the large intestine (colon).  You may have a few pouches or many of them.  This condition is most often diagnosed during an exam for other colon problems.  Treatment may include increasing the fiber in your diet, taking supplements, or taking medicines. This information is not intended to replace advice given to you by your health care provider. Make sure you discuss any questions you have with your health care provider. Document Revised: 06/22/2019 Document Reviewed: 06/22/2019 Elsevier Patient Education  2021 [place].  Colonoscopy, Adult, Care After This sheet gives you information about how to care for yourself after your procedure. Your doctor may also give you more specific instructions. If you have problems or questions, call your doctor. What can I expect after the procedure? After the procedure, it is common to have:  A small amount of blood in your poop (stool) for 24 hours.  Some gas.  Mild cramping or bloating in your belly (abdomen). Follow these instructions at home: Eating and drinking  Drink enough fluid to keep your pee (urine) pale yellow.  Follow instructions from your doctor about what you cannot eat or drink.  Return to your normal diet as told by your doctor. Avoid heavy or fried foods that are hard to digest.   Activity  Rest as told by your doctor.  Do not sit for a long time without moving. Get up to take short walks every 1-2 hours. This is important. Ask for help if you feel weak or unsteady.  Return to your normal activities as told by your doctor. Ask your doctor what activities are safe for you. To help cramping and bloating:  Try walking around.  Put heat on your belly as told by your doctor. Use the heat source that your doctor recommends, such as a moist heat pack or a heating pad. ? Put a towel between your skin and the  heat source. ?  Leave the heat on for 20-30 minutes. ? Remove the heat if your skin turns bright red. This is very important if you are unable to feel pain, heat, or cold. You may have a greater risk of getting burned.   General instructions  If you were given a medicine to help you relax (sedative) during your procedure, it can affect you for many hours. Do not drive or use machinery until your doctor says that it is safe.  For the first 24 hours after the procedure: ? Do not sign important documents. ? Do not drink alcohol. ? Do your daily activities more slowly than normal. ? Eat foods that are soft and easy to digest.  Take over-the-counter or prescription medicines only as told by your doctor.  Keep all follow-up visits as told by your doctor. This is important. Contact a doctor if:  You have blood in your poop 2-3 days after the procedure. Get help right away if:  You have more than a small amount of blood in your poop.  You see large clumps of tissue (blood clots) in your poop.  Your belly is swollen.  You feel like you may vomit (nauseous).  You vomit.  You have a fever.  You have belly pain that gets worse, and medicine does not help your pain. Summary  After the procedure, it is common to have a small amount of blood in your poop. You may also have mild cramping and bloating in your belly.  If you were given a medicine to help you relax (sedative) during your procedure, it can affect you for many hours. Do not drive or use machinery until your doctor says that it is safe.  Get help right away if you have a lot of blood in your poop, feel like you may vomit, have a fever, or have more belly pain. This information is not intended to replace advice given to you by your health care provider. Make sure you discuss any questions you have with your health care provider. Document Revised: 09/29/2019 Document Reviewed: 06/19/2019 Elsevier Patient Education  [place].

## 2021-04-09 NOTE — Transfer of Care (Signed)
Immediate Anesthesia Transfer of Care Note  Patient: Melanie Howard  Procedure(s) Performed: COLONOSCOPY WITH PROPOFOL (N/A ) POLYPECTOMY INTESTINAL  Patient Location: Endoscopy Unit  Anesthesia Type:General  Level of Consciousness: awake  Airway & Oxygen Therapy: Patient Spontanous Breathing  Post-op Assessment: Report given to RN  Post vital signs: Reviewed and stable  Last Vitals:  Vitals Value Taken Time  BP    Temp    Pulse    Resp    SpO2      Last Pain:  Vitals:   04/09/21 0821  TempSrc:   PainSc: 0-No pain      Patients Stated Pain Goal: 7 (16/01/09 3235)  Complications: No complications documented.

## 2021-04-10 LAB — SURGICAL PATHOLOGY

## 2021-04-14 ENCOUNTER — Encounter (INDEPENDENT_AMBULATORY_CARE_PROVIDER_SITE_OTHER): Payer: Self-pay | Admitting: *Deleted

## 2021-04-15 ENCOUNTER — Encounter (HOSPITAL_COMMUNITY): Payer: Self-pay | Admitting: Gastroenterology

## 2021-04-24 ENCOUNTER — Ambulatory Visit (INDEPENDENT_AMBULATORY_CARE_PROVIDER_SITE_OTHER): Payer: 59 | Admitting: Internal Medicine

## 2021-04-24 ENCOUNTER — Ambulatory Visit (INDEPENDENT_AMBULATORY_CARE_PROVIDER_SITE_OTHER)
Admission: RE | Admit: 2021-04-24 | Discharge: 2021-04-24 | Disposition: A | Discharge status: Home / Self Care | Payer: Self-pay | Source: Ambulatory Visit | Attending: Cardiology | Admitting: Cardiology

## 2021-04-24 ENCOUNTER — Other Ambulatory Visit: Payer: Self-pay

## 2021-04-24 DIAGNOSIS — I6522 Occlusion and stenosis of left carotid artery: Secondary | ICD-10-CM

## 2021-05-01 ENCOUNTER — Telehealth (INDEPENDENT_AMBULATORY_CARE_PROVIDER_SITE_OTHER): Payer: Self-pay

## 2021-05-01 NOTE — Telephone Encounter (Signed)
Called patient and gave her the information. Patient verbalized an understanding and will call back next week to let us know how she is doing.

## 2021-05-01 NOTE — Telephone Encounter (Signed)
I would recommend that she take half a tablet of the estradiol 1 mg daily (so she will be taking estradiol 0.5 mg daily) but continue with the progesterone 200 mg at night.  Hopefully this will help all her symptoms.  If it does not, she should let me know.

## 2021-05-01 NOTE — Telephone Encounter (Signed)
Patient called and left a detailed voice message stating that she has been taking her increased hormone medications and now she is having cramps, menstrual periods, and increased irritability and patient asked if she needs to go back on her original dosage or does she need to stop taking the hormone pills. Please advise.

## 2021-05-14 ENCOUNTER — Other Ambulatory Visit: Payer: Self-pay

## 2021-05-14 ENCOUNTER — Other Ambulatory Visit (HOSPITAL_COMMUNITY): Payer: Self-pay

## 2021-05-14 ENCOUNTER — Ambulatory Visit (INDEPENDENT_AMBULATORY_CARE_PROVIDER_SITE_OTHER): Payer: 59 | Admitting: Internal Medicine

## 2021-05-14 ENCOUNTER — Encounter (INDEPENDENT_AMBULATORY_CARE_PROVIDER_SITE_OTHER): Payer: Self-pay | Admitting: Internal Medicine

## 2021-05-14 VITALS — BP 120/70 | HR 78 | Temp 97.9°F | Resp 18 | Ht 62.0 in | Wt 132.0 lb

## 2021-05-14 DIAGNOSIS — E2839 Other primary ovarian failure: Secondary | ICD-10-CM | POA: Diagnosis not present

## 2021-05-14 DIAGNOSIS — E785 Hyperlipidemia, unspecified: Secondary | ICD-10-CM | POA: Diagnosis not present

## 2021-05-14 MED ORDER — PROGESTERONE 200 MG PO CAPS
200.0000 mg | ORAL_CAPSULE | Freq: Every day | ORAL | 1 refills | Status: DC
  Filled 2021-05-14: qty 90, 90d supply, fill #0

## 2021-05-14 MED ORDER — ESTRADIOL 0.5 MG PO TABS
0.5000 mg | ORAL_TABLET | Freq: Every morning | ORAL | 1 refills | Status: DC
  Filled 2021-05-14: qty 90, 90d supply, fill #0

## 2021-05-14 NOTE — Progress Notes (Signed)
Metrics: Intervention Frequency ACO  Documented Smoking Status Yearly  Screened one or more times in 24 months  Cessation Counseling or  Active cessation medication Past 24 months  Past 24 months   Guideline developer: UpToDate (See UpToDate for funding source) Date Released: 2014       Wellness Office Visit  Subjective:  Patient ID: Melanie Howard, female    DOB: August 14, 1961  Age: 60 y.o. MRN: [id number]  CC: This lady comes in for follow-up of bioidentical hormone therapy. HPI  Higher dose of estradiol produced bleeding.  She cut the dose to half the dose that she is taking estradiol 0.5 mg daily.  She continues on progesterone 200 mg at night.  She seems to be tolerating this combination. Her cardiologist to start her on a statin. Past Medical History:  Diagnosis Date  . Carotid artery disease ([place])   . COVID   . History of kidney stones   . Hyperlipidemia   . Hypertension    Past Surgical History:  Procedure Laterality Date  . COLONOSCOPY WITH PROPOFOL N/A 04/09/2021   Procedure: COLONOSCOPY WITH PROPOFOL;  Surgeon: Harvel Quale, MD;  Location: [facility];  Service: Gastroenterology;  Laterality: N/A;  8:45  . COLPOSCOPY    . GYNECOLOGIC CRYOSURGERY  college   paps normal since  . POLYPECTOMY  04/09/2021   Procedure: POLYPECTOMY INTESTINAL;  Surgeon: Harvel Quale, MD;  Location: [facility];  Service: Gastroenterology;;  . TUBAL LIGATION       Family History  Problem Relation Age of Onset  . Hypertension Mother   . Hypertension Father   . Heart disease Father   . Breast cancer Maternal Grandmother        Age unknown  . Depression Sister   . Restless legs syndrome Sister   . 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 Smoking status: Never Smoker  . Smokeless tobacco: Never Used   Substance Use Topics  . Alcohol use: Yes    Comment: Rare; socially on weekends    Current Meds  Medication Sig  . acetaminophen (TYLENOL) 325 MG tablet Take 650 mg by mouth every 6 (six) hours as needed (for pain.).  Marland Kitchen amLODipine (NORVASC) 2.5 MG tablet TAKE 1 TABLET (2.5 MG TOTAL) BY MOUTH DAILY. (Patient taking differently: Take 2.5 mg by mouth in the morning.)  . Cholecalciferol 250 MCG (10000 UT) CAPS Take 10,000 Units by mouth in the morning.  . Ibuprofen (ADVIL PO) Take by mouth as needed.  Marland Kitchen ibuprofen (ADVIL) 200 MG tablet Take 200 mg by mouth every 8 (eight) hours as needed (for pain.).  Marland Kitchen rosuvastatin (CRESTOR) 40 MG tablet Take 1 tablet (40 mg total) by mouth daily. (Patient taking differently: Take 40 mg by mouth daily with lunch.)  . [DISCONTINUED] estradiol (ESTRACE) 0.5 MG tablet Take 0.5 mg by mouth in the morning.  . [DISCONTINUED] progesterone (PROMETRIUM) 200 MG capsule Take 1 capsule (200 mg total) by mouth daily.     [facility] Office Visit from 01/09/2021 in [facility]  PHQ-9 Total Score 0      Objective:   Today's Vitals: BP 120/70 (BP Location: Left Arm, Patient Position: Sitting, Cuff Size: Normal)   Pulse 78   Temp 97.9 F (36.6 C) (Temporal)   Resp 18   Ht  5\' 2"  (1.575 m)   Wt 132 lb (59.9 kg)   LMP 01/08/2014   SpO2 96%   BMI 24.14 kg/m  Vitals with BMI 05/14/2021 04/09/2021 04/09/2021  Height 5\' 2"  - 5\' 2"   Weight 132 lbs - 133 lbs  BMI 16.96 - 78.93  Systolic 810 93 175  Diastolic 70 80 72  Pulse 78 - 65     Physical Exam  She looks systemically well.  No new physical findings.     Assessment   1. Primary ovarian failure   2. Hyperlipidemia, unspecified hyperlipidemia type       Tests ordered Orders Placed This Encounter  Procedures  . Estradiol  . Progesterone     Plan: 1. Continue with estradiol 0.5 mg daily and progesterone 200 mg at night.  She is not interested at the present time and testosterone  therapy. 2. We will check levels of these hormones today. 3. Further recommendations will depend on blood results and I will see her in October for an annual physical exam.   Meds ordered this encounter  Medications  . estradiol (ESTRACE) 0.5 MG tablet    Sig: Take 1 tablet (0.5 mg total) by mouth in the morning.    Dispense:  90 tablet    Refill:  1  . progesterone (PROMETRIUM) 200 MG capsule    Sig: Take 1 capsule (200 mg total) by mouth daily.    Dispense:  90 capsule    Refill:  1    Melanie Egelston Luther Parody, MD

## 2021-05-14 NOTE — Progress Notes (Signed)
Decrease estradiol; had a period  Start back up.

## 2021-05-15 LAB — PROGESTERONE: Progesterone: 26.3 ng/mL

## 2021-05-15 LAB — ESTRADIOL: Estradiol: 51 pg/mL

## 2021-06-13 ENCOUNTER — Other Ambulatory Visit (HOSPITAL_COMMUNITY): Payer: Self-pay

## 2021-06-13 MED FILL — Amlodipine Besylate Tab 2.5 MG (Base Equivalent): ORAL | 90 days supply | Qty: 90 | Fill #0 | Status: AC

## 2021-07-04 ENCOUNTER — Encounter: Payer: Self-pay | Admitting: *Deleted

## 2021-07-09 DIAGNOSIS — E7800 Pure hypercholesterolemia, unspecified: Secondary | ICD-10-CM | POA: Diagnosis not present

## 2021-07-09 LAB — HEPATIC FUNCTION PANEL
ALT: 26 IU/L (ref 0–32)
AST: 25 IU/L (ref 0–40)
Albumin: 4.9 g/dL (ref 3.8–4.9)
Alkaline Phosphatase: 87 IU/L (ref 44–121)
Bilirubin Total: 0.6 mg/dL (ref 0.0–1.2)
Bilirubin, Direct: 0.15 mg/dL (ref 0.00–0.40)
Total Protein: 7.4 g/dL (ref 6.0–8.5)

## 2021-07-09 LAB — LIPID PANEL
Chol/HDL Ratio: 2 ratio (ref 0.0–4.4)
Cholesterol, Total: 129 mg/dL (ref 100–199)
HDL: 66 mg/dL (ref >39)
LDL Chol Calc (NIH): 49 mg/dL (ref 0–99)
Triglycerides: 68 mg/dL (ref 0–149)
VLDL Cholesterol Cal: 14 mg/dL (ref 5–40)

## 2021-07-10 ENCOUNTER — Other Ambulatory Visit (HOSPITAL_COMMUNITY): Payer: Self-pay

## 2021-09-10 ENCOUNTER — Encounter (INDEPENDENT_AMBULATORY_CARE_PROVIDER_SITE_OTHER): Payer: 59 | Admitting: Internal Medicine

## 2021-09-18 ENCOUNTER — Other Ambulatory Visit: Payer: Self-pay

## 2021-09-18 ENCOUNTER — Other Ambulatory Visit (HOSPITAL_COMMUNITY): Payer: Self-pay

## 2021-09-18 ENCOUNTER — Encounter (INDEPENDENT_AMBULATORY_CARE_PROVIDER_SITE_OTHER): Payer: 59 | Admitting: Internal Medicine

## 2021-09-18 ENCOUNTER — Other Ambulatory Visit (INDEPENDENT_AMBULATORY_CARE_PROVIDER_SITE_OTHER): Payer: Self-pay | Admitting: Internal Medicine

## 2021-09-18 ENCOUNTER — Encounter: Payer: Self-pay | Admitting: *Deleted

## 2021-09-18 ENCOUNTER — Other Ambulatory Visit: Payer: Self-pay | Admitting: Obstetrics and Gynecology

## 2021-09-18 ENCOUNTER — Ambulatory Visit (HOSPITAL_COMMUNITY)
Admission: RE | Admit: 2021-09-18 | Discharge: 2021-09-18 | Disposition: A | Discharge status: Home / Self Care | Payer: 59 | Source: Ambulatory Visit | Attending: Cardiology | Admitting: Cardiology

## 2021-09-18 ENCOUNTER — Other Ambulatory Visit (INDEPENDENT_AMBULATORY_CARE_PROVIDER_SITE_OTHER): Payer: Self-pay

## 2021-09-18 DIAGNOSIS — I6522 Occlusion and stenosis of left carotid artery: Secondary | ICD-10-CM | POA: Diagnosis not present

## 2021-09-18 DIAGNOSIS — I10 Essential (primary) hypertension: Secondary | ICD-10-CM

## 2021-09-18 DIAGNOSIS — Z1231 Encounter for screening mammogram for malignant neoplasm of breast: Secondary | ICD-10-CM

## 2021-09-18 MED ORDER — AMLODIPINE BESYLATE 5 MG PO TABS
5.0000 mg | ORAL_TABLET | Freq: Every day | ORAL | 3 refills | Status: DC
  Filled 2021-09-18: qty 90, 90d supply, fill #0
  Filled 2021-12-16: qty 90, 90d supply, fill #1
  Filled 2022-03-17: qty 90, 90d supply, fill #2
  Filled 2022-06-16: qty 90, 90d supply, fill #3

## 2021-09-19 ENCOUNTER — Other Ambulatory Visit (HOSPITAL_COMMUNITY): Payer: Self-pay

## 2021-09-22 ENCOUNTER — Other Ambulatory Visit (HOSPITAL_COMMUNITY): Payer: Self-pay

## 2021-11-04 ENCOUNTER — Other Ambulatory Visit (HOSPITAL_COMMUNITY): Payer: Self-pay | Admitting: Cardiology

## 2021-11-04 DIAGNOSIS — I6522 Occlusion and stenosis of left carotid artery: Secondary | ICD-10-CM

## 2021-12-16 ENCOUNTER — Other Ambulatory Visit (HOSPITAL_COMMUNITY): Payer: Self-pay

## 2022-01-06 ENCOUNTER — Emergency Department (HOSPITAL_COMMUNITY)
Admission: EM | Admit: 2022-01-06 | Discharge: 2022-01-06 | Disposition: A | Discharge status: Home / Self Care | Payer: 59 | Attending: Emergency Medicine | Admitting: Emergency Medicine

## 2022-01-06 ENCOUNTER — Other Ambulatory Visit: Payer: Self-pay

## 2022-01-06 ENCOUNTER — Encounter (HOSPITAL_COMMUNITY): Payer: Self-pay | Admitting: Emergency Medicine

## 2022-01-06 ENCOUNTER — Emergency Department (HOSPITAL_COMMUNITY): Payer: 59

## 2022-01-06 DIAGNOSIS — I10 Essential (primary) hypertension: Secondary | ICD-10-CM | POA: Insufficient documentation

## 2022-01-06 DIAGNOSIS — G9389 Other specified disorders of brain: Secondary | ICD-10-CM | POA: Diagnosis not present

## 2022-01-06 DIAGNOSIS — Z20822 Contact with and (suspected) exposure to covid-19: Secondary | ICD-10-CM | POA: Diagnosis not present

## 2022-01-06 DIAGNOSIS — R42 Dizziness and giddiness: Secondary | ICD-10-CM | POA: Diagnosis not present

## 2022-01-06 DIAGNOSIS — R112 Nausea with vomiting, unspecified: Secondary | ICD-10-CM | POA: Diagnosis present

## 2022-01-06 DIAGNOSIS — Z79899 Other long term (current) drug therapy: Secondary | ICD-10-CM | POA: Diagnosis not present

## 2022-01-06 DIAGNOSIS — I610 Nontraumatic intracerebral hemorrhage in hemisphere, subcortical: Secondary | ICD-10-CM

## 2022-01-06 DIAGNOSIS — I618 Other nontraumatic intracerebral hemorrhage: Secondary | ICD-10-CM | POA: Insufficient documentation

## 2022-01-06 DIAGNOSIS — R270 Ataxia, unspecified: Secondary | ICD-10-CM | POA: Diagnosis not present

## 2022-01-06 LAB — CBC WITH DIFFERENTIAL/PLATELET
Abs Immature Granulocytes: 0.04 10*3/uL (ref 0.00–0.07)
Basophils Absolute: 0.1 10*3/uL (ref 0.0–0.1)
Basophils Relative: 1 %
Eosinophils Absolute: 0.1 10*3/uL (ref 0.0–0.5)
Eosinophils Relative: 1 %
HCT: 43 % (ref 36.0–46.0)
Hemoglobin: 14.8 g/dL (ref 12.0–15.0)
Immature Granulocytes: 0 %
Lymphocytes Relative: 24 %
Lymphs Abs: 2.2 10*3/uL (ref 0.7–4.0)
MCH: 31.2 pg (ref 26.0–34.0)
MCHC: 34.4 g/dL (ref 30.0–36.0)
MCV: 90.7 fL (ref 80.0–100.0)
Monocytes Absolute: 0.6 10*3/uL (ref 0.1–1.0)
Monocytes Relative: 6 %
Neutro Abs: 6.4 10*3/uL (ref 1.7–7.7)
Neutrophils Relative %: 68 %
Platelets: 211 10*3/uL (ref 150–400)
RBC: 4.74 MIL/uL (ref 3.87–5.11)
RDW: 12.1 % (ref 11.5–15.5)
WBC: 9.3 10*3/uL (ref 4.0–10.5)
nRBC: 0 % (ref 0.0–0.2)

## 2022-01-06 LAB — BASIC METABOLIC PANEL
Anion gap: 13 (ref 5–15)
BUN: 11 mg/dL (ref 6–20)
CO2: 24 mmol/L (ref 22–32)
Calcium: 9.5 mg/dL (ref 8.9–10.3)
Chloride: 101 mmol/L (ref 98–111)
Creatinine, Ser: 0.68 mg/dL (ref 0.44–1.00)
GFR, Estimated: >60 mL/min (ref >60)
Glucose, Bld: 120 mg/dL — ABNORMAL HIGH (ref 70–99)
Potassium: 3.4 mmol/L — ABNORMAL LOW (ref 3.5–5.1)
Sodium: 138 mmol/L (ref 135–145)

## 2022-01-06 LAB — TSH: TSH: 1.958 u[IU]/mL (ref 0.350–4.500)

## 2022-01-06 LAB — RESP PANEL BY RT-PCR (FLU A&B, COVID) ARPGX2
Influenza A by PCR: NEGATIVE
Influenza B by PCR: NEGATIVE
SARS Coronavirus 2 by RT PCR: NEGATIVE

## 2022-01-06 MED ORDER — GADOBUTROL 1 MMOL/ML IV SOLN
6.0000 mL | Freq: Once | INTRAVENOUS | Status: AC | PRN
  Administered 2022-01-06: 6 mL via INTRAVENOUS

## 2022-01-06 NOTE — Discharge Instructions (Addendum)
Your work-up today was overall very reassuring.  All of your labs were normal and your MRI did not show any signs of acute stroke or infarct.  It was noted that you have a chronic hemorrhage within the caudate in your brain that you say that you are aware of - I spoke with neurology, and they do not believe any additional work-up is needed at this time, however you can follow-up with your neurosurgeon as needed.  I have also provided you a neuro interventionalists referral that I recommend that you follow up with as well. I spoke with one of the neuro internationalists who recommends that you avoid using NSAIDs for now to minimize the risk of future bleeding.  If you have worsening dizziness or ataxia, please return to the ED.

## 2022-01-06 NOTE — ED Provider Notes (Signed)
[facility] EMERGENCY DEPARTMENT Provider Note   CSN: [id number] Arrival date & time: 01/06/22  1646     History  Chief Complaint  Patient presents with   Dizziness   Nausea   Emesis    Melanie Howard is a 61 y.o. female with history of hypertension, hyperlipidemia, and left carotid artery stenosis currently taking amlodipine and rosuvastatin who presents to the ED for evaluation of dizziness and vomiting that has been ongoing for 3 days.  Patient states that her dizziness feels like the room is moving back and forth while she is standing and sometimes when she is sitting.  That has been constant for the last couple days, however today it seems to have worsened and had several episodes of vomiting as well.  She denies fever, chills, chest pain, shortness of breath, abdominal pain, diarrhea.  She has a chronic history of headaches but states that she is not having headaches in conjunction with this dizziness.   Dizziness Associated symptoms: nausea and vomiting   Associated symptoms: no headaches and no shortness of breath   Emesis Associated symptoms: no abdominal pain, no fever and no headaches       Home Medications Prior to Admission medications   Medication Sig Start Date End Date Taking? Authorizing Provider  acetaminophen (TYLENOL) 325 MG tablet Take 650 mg by mouth every 6 (six) hours as needed (for pain.).    [provider]  amLODipine (NORVASC) 5 MG tablet Take 1 tablet (5 mg total) by mouth daily. 09/18/21 09/18/22  Lelon Perla, MD  Cholecalciferol 250 MCG (10000 UT) CAPS Take 10,000 Units by mouth in the morning.    [provider]  estradiol (ESTRACE) 0.5 MG tablet Take 1 tablet (0.5 mg total) by mouth in the morning. 05/14/21   Doree Albee, MD  Ibuprofen (ADVIL PO) Take by mouth as needed.    [provider]  ibuprofen (ADVIL) 200 MG tablet Take 200 mg by mouth every 8 (eight) hours as needed (for pain.).     [provider]  progesterone (PROMETRIUM) 200 MG capsule Take 1 capsule (200 mg total) by mouth daily. 05/14/21   Doree Albee, MD  rosuvastatin (CRESTOR) 40 MG tablet Take 1 tablet (40 mg total) by mouth daily. Patient taking differently: Take 40 mg by mouth daily with lunch. 03/25/21   Lelon Perla, MD      Allergies    Patient has no known allergies.    Review of Systems   Review of Systems  Constitutional:  Negative for fever.  HENT: Negative.    Eyes: Negative.   Respiratory:  Negative for shortness of breath.   Cardiovascular: Negative.   Gastrointestinal:  Positive for nausea and vomiting. Negative for abdominal pain.  Endocrine: Negative.   Genitourinary: Negative.   Musculoskeletal: Negative.   Skin:  Negative for rash.  Neurological:  Positive for dizziness. Negative for headaches.  All other systems reviewed and are negative.  Physical Exam Updated Vital Signs BP 140/77 (BP Location: Right Arm)    Pulse 79    Temp (!) 97.4 F (36.3 C) (Oral)    Resp 18    LMP 01/08/2014    SpO2 100%  Physical Exam Vitals and nursing note reviewed.  Constitutional:      General: She is not in acute distress.    Appearance: She is not ill-appearing.  HENT:     Head: Atraumatic.  Eyes:     Conjunctiva/sclera: Conjunctivae normal.  Cardiovascular:     Rate and Rhythm: Normal rate and regular rhythm.     Pulses: Normal pulses.     Heart sounds: No murmur heard. Pulmonary:     Effort: Pulmonary effort is normal. No respiratory distress.     Breath sounds: Normal breath sounds.  Abdominal:     General: Abdomen is flat. There is no distension.     Palpations: Abdomen is soft.     Tenderness: There is no abdominal tenderness.  Musculoskeletal:        General: Normal range of motion.     Cervical back: Normal range of motion.  Skin:    General: Skin is warm and dry.     Capillary Refill: Capillary refill takes less than 2 seconds.  Neurological:     General: No  focal deficit present.     Mental Status: She is alert.     Comments: Speech is clear, able to follow commands CN III-XII intact Normal strength in upper and lower extremities bilaterally including dorsiflexion and plantar flexion, strong and equal grip strength Sensation normal to light and sharp touch Moves extremities without ataxia, coordination intact Normal finger to nose and rapid alternating movements No pronator drift      Psychiatric:        Mood and Affect: Mood normal.    ED Results / Procedures / Treatments   Labs (all labs ordered are listed, but only abnormal results are displayed) Labs Reviewed  RESP PANEL BY RT-PCR (FLU A&B, COVID) ARPGX2  BASIC METABOLIC PANEL  CBC WITH DIFFERENTIAL/PLATELET  URINALYSIS, ROUTINE W REFLEX MICROSCOPIC    EKG None  Radiology No results found.  Procedures Procedures   Medications Ordered in ED Medications - No data to display  ED Course/ Medical Decision Making/ A&P                           Medical Decision Making Amount and/or Complexity of Data Reviewed Labs: ordered. Radiology: ordered.  Risk Prescription drug management.   History:  Melanie Howard is a 61 y.o. female with history of hypertension, hyperlipidemia, and left carotid artery stenosis currently taking amlodipine and rosuvastatin who presents to the ED for evaluation of dizziness and vomiting that has been ongoing for 3 days.  Patient states that her dizziness feels like the room is moving back and forth while she is standing and sometimes when she is sitting.  That has been constant for the last couple days, however today it seems to have worsened and had several episodes of vomiting as well.  She denies fever, chills, chest pain, shortness of breath, abdominal pain, diarrhea.  She has a chronic history of headaches but states that she is not having headaches in conjunction with this dizziness. Additional history obtained from coworker This  patient presents to the ED for concern of dizziness, this involves an extensive number of treatment options, and is a complaint that carries with it a high risk of complications and morbidity.   Differentials for the same complaint includes cerebellar stroke, migraine, BPPV, traumatic injury, Mnire's disease, barotrauma, laryngitis, vertebral artery dissection, vertebrobasilar insufficiency, alcohol withdrawal, thyroid disease  Initial impression:  Patient is well-appearing, nontoxic.  Sitting up in bed speaking in full complete sentences.  Neuro exam is reassuring.  No focal deficits.  However her story is concerning for possible cerebellar stroke.  MRI brain ordered along with labs.  Lab Tests and EKG:  I Ordered, reviewed, and  interpreted labs and EKG.   CBC without leukocytosis or anemia BMP unremarkable Respiratory panel negative TSH normal   Imaging Studies ordered:  I ordered imaging studies including MRI brain which was negative for acute infarct, however there was evidence of a chronic hemorrhage or hemorrhagic infarct within the caudate. I independently visualized and interpreted imaging and I agree with the radiologist interpretation. Decisions made regarding results are detailed in the ED course and/or initial impression section above.   Cardiac Monitoring:  The patient was maintained on a cardiac monitor.  I personally viewed and interpreted the cardiac monitored which showed an underlying rhythm of: NSR   Consultations Obtained:  I requested consultation with Dr. Cheyenne Adas in neurology who reviewed patient's MRI and previous MRIs.  He notes that the chronic hemorrhage was previously noted and appears stable, however it did bleed in the past and is concerned that it may bleed in the future.  He does not feel that any emergent work-up is needed but he does recommend that she follow-up outpatient with neuro interventional radiology to discuss the need for further work-up or  intervention.  Also spoke with Dr. Estanislado Pandy with neuro interventional radiology who recommends patient to follow-up outpatient and to discontinue the use of NSAIDs to minimize risk of bleeding.    Disposition:  After consideration of the diagnostic results, physical exam, history and the patients response to treatment feel that the patent would benefit from discharge with strict return precautions and outpatient follow-up.   Hemorrhage within caudate nucleus Dizziness: Patient's work-up was overall reassuring.  MRI without acute infarct.  Patient has stable hemorrhage within the caudate.  This can be followed up outpatient.  Patient instructed to discontinue NSAIDs to minimize risk of future bleeding.  Strict return precautions were discussed.  All questions asked and answered.  Patient discharged home in stable condition.    Final Clinical Impression(s) / ED Diagnoses Final diagnoses:  Dizziness  Hemorrhage in caudate nucleus [facility])    Rx / DC Orders ED Discharge Orders     None         Rodena Piety 01/06/22 2028    Drenda Freeze, MD 01/06/22 2116

## 2022-01-06 NOTE — ED Triage Notes (Signed)
Patient coming from work, complaint of persistent dizziness since Sunday, with onset of N/V today.VSS.

## 2022-01-07 ENCOUNTER — Other Ambulatory Visit (HOSPITAL_COMMUNITY): Payer: Self-pay | Admitting: Interventional Radiology

## 2022-01-07 DIAGNOSIS — R42 Dizziness and giddiness: Secondary | ICD-10-CM

## 2022-01-07 DIAGNOSIS — R519 Headache, unspecified: Secondary | ICD-10-CM

## 2022-01-12 ENCOUNTER — Ambulatory Visit (HOSPITAL_COMMUNITY)
Admission: RE | Admit: 2022-01-12 | Discharge: 2022-01-12 | Disposition: A | Discharge status: Home / Self Care | Payer: 59 | Source: Ambulatory Visit | Attending: Interventional Radiology | Admitting: Interventional Radiology

## 2022-01-12 ENCOUNTER — Other Ambulatory Visit: Payer: Self-pay

## 2022-01-12 DIAGNOSIS — I618 Other nontraumatic intracerebral hemorrhage: Secondary | ICD-10-CM | POA: Diagnosis not present

## 2022-01-12 DIAGNOSIS — R519 Headache, unspecified: Secondary | ICD-10-CM

## 2022-01-12 DIAGNOSIS — R42 Dizziness and giddiness: Secondary | ICD-10-CM

## 2022-01-15 HISTORY — PX: IR RADIOLOGIST EVAL & MGMT: IMG5224

## 2022-01-20 ENCOUNTER — Other Ambulatory Visit (HOSPITAL_COMMUNITY): Payer: Self-pay | Admitting: Interventional Radiology

## 2022-01-20 ENCOUNTER — Telehealth (HOSPITAL_COMMUNITY): Payer: Self-pay

## 2022-01-20 DIAGNOSIS — R519 Headache, unspecified: Secondary | ICD-10-CM

## 2022-01-20 DIAGNOSIS — R42 Dizziness and giddiness: Secondary | ICD-10-CM

## 2022-01-20 NOTE — Telephone Encounter (Signed)
Called to schedule angiogram, no answer, left vm. AW 

## 2022-01-28 ENCOUNTER — Other Ambulatory Visit: Payer: Self-pay | Admitting: Radiology

## 2022-01-29 ENCOUNTER — Encounter (HOSPITAL_COMMUNITY): Payer: Self-pay

## 2022-01-29 ENCOUNTER — Ambulatory Visit (HOSPITAL_COMMUNITY)
Admission: RE | Admit: 2022-01-29 | Discharge: 2022-01-29 | Disposition: A | Discharge status: Home / Self Care | Payer: 59 | Source: Ambulatory Visit | Attending: Interventional Radiology | Admitting: Interventional Radiology

## 2022-01-29 ENCOUNTER — Other Ambulatory Visit: Payer: Self-pay

## 2022-01-29 ENCOUNTER — Other Ambulatory Visit (HOSPITAL_COMMUNITY): Payer: Self-pay | Admitting: Interventional Radiology

## 2022-01-29 DIAGNOSIS — R519 Headache, unspecified: Secondary | ICD-10-CM

## 2022-01-29 DIAGNOSIS — I6501 Occlusion and stenosis of right vertebral artery: Secondary | ICD-10-CM | POA: Insufficient documentation

## 2022-01-29 DIAGNOSIS — I871 Compression of vein: Secondary | ICD-10-CM | POA: Insufficient documentation

## 2022-01-29 DIAGNOSIS — I10 Essential (primary) hypertension: Secondary | ICD-10-CM | POA: Insufficient documentation

## 2022-01-29 DIAGNOSIS — R42 Dizziness and giddiness: Secondary | ICD-10-CM | POA: Insufficient documentation

## 2022-01-29 DIAGNOSIS — Z8616 Personal history of COVID-19: Secondary | ICD-10-CM | POA: Insufficient documentation

## 2022-01-29 DIAGNOSIS — I658 Occlusion and stenosis of other precerebral arteries: Secondary | ICD-10-CM | POA: Diagnosis not present

## 2022-01-29 DIAGNOSIS — E785 Hyperlipidemia, unspecified: Secondary | ICD-10-CM | POA: Insufficient documentation

## 2022-01-29 DIAGNOSIS — I618 Other nontraumatic intracerebral hemorrhage: Secondary | ICD-10-CM | POA: Diagnosis not present

## 2022-01-29 DIAGNOSIS — I773 Arterial fibromuscular dysplasia: Secondary | ICD-10-CM | POA: Diagnosis not present

## 2022-01-29 HISTORY — PX: IR ANGIO VERTEBRAL SEL VERTEBRAL BILAT MOD SED: IMG5369

## 2022-01-29 HISTORY — PX: IR US GUIDE VASC ACCESS RIGHT: IMG2390

## 2022-01-29 HISTORY — PX: IR ANGIO INTRA EXTRACRAN SEL COM CAROTID INNOMINATE BILAT MOD SED: IMG5360

## 2022-01-29 LAB — PROTIME-INR
INR: 0.9 (ref 0.8–1.2)
Prothrombin Time: 12.1 seconds (ref 11.4–15.2)

## 2022-01-29 LAB — BASIC METABOLIC PANEL
Anion gap: 8 (ref 5–15)
BUN: 14 mg/dL (ref 6–20)
CO2: 26 mmol/L (ref 22–32)
Calcium: 9.1 mg/dL (ref 8.9–10.3)
Chloride: 106 mmol/L (ref 98–111)
Creatinine, Ser: 0.66 mg/dL (ref 0.44–1.00)
GFR, Estimated: >60 mL/min (ref >60)
Glucose, Bld: 98 mg/dL (ref 70–99)
Potassium: 4.6 mmol/L (ref 3.5–5.1)
Sodium: 140 mmol/L (ref 135–145)

## 2022-01-29 LAB — CBC
HCT: 41.3 % (ref 36.0–46.0)
Hemoglobin: 14.2 g/dL (ref 12.0–15.0)
MCH: 30.9 pg (ref 26.0–34.0)
MCHC: 34.4 g/dL (ref 30.0–36.0)
MCV: 89.8 fL (ref 80.0–100.0)
Platelets: 226 10*3/uL (ref 150–400)
RBC: 4.6 MIL/uL (ref 3.87–5.11)
RDW: 12.2 % (ref 11.5–15.5)
WBC: 8.2 10*3/uL (ref 4.0–10.5)
nRBC: 0 % (ref 0.0–0.2)

## 2022-01-29 MED ORDER — SODIUM CHLORIDE 0.9 % IV SOLN: INTRAVENOUS | Status: AC

## 2022-01-29 MED ORDER — FENTANYL CITRATE (PF) 100 MCG/2ML IJ SOLN
INTRAMUSCULAR | Status: AC
  Filled 2022-01-29: qty 2

## 2022-01-29 MED ORDER — SODIUM CHLORIDE 0.9 % IV SOLN: Freq: Once | INTRAVENOUS | Status: AC

## 2022-01-29 MED ORDER — MIDAZOLAM HCL 2 MG/2ML IJ SOLN
INTRAMUSCULAR | Status: AC
  Filled 2022-01-29: qty 2

## 2022-01-29 MED ORDER — MIDAZOLAM HCL 2 MG/2ML IJ SOLN
INTRAMUSCULAR | Status: AC | PRN
  Administered 2022-01-29: 1 mg via INTRAVENOUS

## 2022-01-29 MED ORDER — HEPARIN SODIUM (PORCINE) 1000 UNIT/ML IJ SOLN
INTRAMUSCULAR | Status: AC
  Filled 2022-01-29: qty 10

## 2022-01-29 MED ORDER — NITROGLYCERIN 1 MG/10 ML FOR IR/CATH LAB
INTRA_ARTERIAL | Status: AC
  Filled 2022-01-29: qty 10

## 2022-01-29 MED ORDER — IOHEXOL 300 MG/ML  SOLN: 100.0000 mL | Freq: Once | INTRAMUSCULAR | Status: DC | PRN

## 2022-01-29 MED ORDER — VERAPAMIL HCL 2.5 MG/ML IV SOLN
INTRA_ARTERIAL | Status: AC | PRN
  Administered 2022-01-29: 7 mL via INTRA_ARTERIAL

## 2022-01-29 MED ORDER — LIDOCAINE HCL 1 % IJ SOLN
INTRAMUSCULAR | Status: AC
  Administered 2022-01-29: 1 mL via SUBCUTANEOUS
  Filled 2022-01-29: qty 20

## 2022-01-29 MED ORDER — FENTANYL CITRATE (PF) 100 MCG/2ML IJ SOLN
INTRAMUSCULAR | Status: AC | PRN
Start: 2022-01-29 — End: 2022-01-29
  Administered 2022-01-29: 25 ug via INTRAVENOUS

## 2022-01-29 MED ORDER — VERAPAMIL HCL 2.5 MG/ML IV SOLN
INTRAVENOUS | Status: AC
  Filled 2022-01-29: qty 2

## 2022-01-29 NOTE — H&P (Addendum)
Chief Complaint: Patient was seen in consultation today for Cerebral arteriogram at the request of [facility]  Supervising Physician: Luanne Bras  Patient Status: [facility] - Out-pt  History of Present Illness: Melanie Howard is a 61 y.o. female   M ICU RN at [employer] Worsening dizziness during the day Dizziness may last 1-2 hrs--- 2-3 x/week now Denies headache; N/V Denies numbness/tingling Denies change in vision or speech Nonsmoker Known prior hemorrhage at head of caudate on right-- unchanged per recent MRI  MRI: 01/06/22: IMPRESSION: Negative for acute infarct Chronic hemorrhage in the head of caudate on the right. Possible chronic hemorrhagic infarct versus cavernoma. Adjacent developmental venous anomaly in the right basal ganglia.  Referred to Dr Estanislado Pandy for evaluation and management  Consult 01/12/22: Focal area of hemorrhage in the right caudate head. No other gross abnormality is noted.     The flow voids are maintained in the major vessels visualized on the MRI of the brain.      The patient's symptoms may represent symptoms related to a posterior circulation TIA. Middle ear disease may also be a possible etiology.       For further evaluation, assessment of the extracranial and intracranial circulation either with a CT angiogram of the head and neck or more accurately with a diagnostic catheter arteriogram would be indicated. The diagnostic catheter arteriogram was described in detail. This would allow more accurate hemodynamic and neuro anatomic angiographic assessment extra cranially and intracranially. Procedure was described with an extremely low risk of a potential complication. Patient was described the procedure would be via radial right, or femoral route. It will be day procedure.  Now scheduled for Cerebral arteriogram  Past Medical History:  Diagnosis Date   Carotid artery disease ([place])    COVID    History of kidney stones     Hyperlipidemia    Hypertension     Past Surgical History:  Procedure Laterality Date   COLONOSCOPY WITH PROPOFOL N/A 04/09/2021   Procedure: COLONOSCOPY WITH PROPOFOL;  Surgeon: Harvel Quale, MD;  [facility];  Service: Gastroenterology;  Laterality: N/A;  8:45   COLPOSCOPY     GYNECOLOGIC CRYOSURGERY  college   paps normal since   IR RADIOLOGIST EVAL & MGMT  01/15/2022   POLYPECTOMY  04/09/2021   Procedure: POLYPECTOMY INTESTINAL;  Surgeon: Harvel Quale, MD;  Location: [facility];  Service: Gastroenterology;;   TUBAL LIGATION      Allergies: Patient has no known allergies.  Medications: Prior to Admission medications   Medication Sig Start Date End Date Taking? Authorizing Provider  acetaminophen (TYLENOL) 500 MG tablet Take 1,000 mg by mouth every 6 (six) hours as needed for moderate pain.   Yes [provider]  amLODipine (NORVASC) 5 MG tablet Take 1 tablet (5 mg total) by mouth daily. 09/18/21 09/18/22 Yes Lelon Perla, MD  rosuvastatin (CRESTOR) 40 MG tablet Take 1 tablet (40 mg total) by mouth daily. Patient taking differently: Take 40 mg by mouth daily with lunch. 03/25/21  Yes Lelon Perla, MD  VITAMIN D PO Take 1 capsule by mouth daily.   Yes [provider]  estradiol (ESTRACE) 0.5 MG tablet Take 1 tablet (0.5 mg total) by mouth in the morning. Patient not taking: Reported on 01/27/2022 05/14/21   Doree Albee, MD  progesterone (PROMETRIUM) 200 MG capsule Take 1 capsule (200 mg total) by mouth daily. Patient not taking: Reported on 01/27/2022 05/14/21   Hurshel Party  C, MD     Family History  Problem Relation Age of Onset   Hypertension Mother    Hypertension Father    Heart disease Father    Breast cancer Maternal Grandmother        Age unknown   Depression Sister    Restless legs syndrome Sister    Hypertension Brother     Social History   Socioeconomic History   Marital status: Married     Spouse name: Not on file   Number of children: 3   Years of education: Not on file   Highest education level: Not on file  Occupational History   Not on file  Tobacco Use   Smoking status: Never   Smokeless tobacco: Never  Vaping Use   Vaping Use: Never used  Substance and Sexual Activity   Alcohol use: Yes    Comment: Rare; socially on weekends   Drug use: No   Sexual activity: Yes    Birth control/protection: Surgical    Comment: BTL-1st intercourse 61 yo-Fewer than 5 partners  Other Topics Concern   Not on file  Social History Narrative   Married for 32 years.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 ,works in [employer] in [place] Strain: Not on file  Food Insecurity: Not on file  Transportation Needs: Not on file  Physical Activity: Not on file  Stress: Not on file  Social Connections: Not on file    Review of Systems: A 12 point ROS discussed and pertinent positives are indicated in the HPI above.  All other systems are negative.  Review of Systems  Constitutional:  Negative for activity change, fatigue and fever.  HENT:  Negative for hearing loss, tinnitus and trouble swallowing.   Respiratory:  Negative for shortness of breath and wheezing.   Cardiovascular:  Negative for chest pain and leg swelling.  Gastrointestinal:  Negative for abdominal pain, nausea and vomiting.  Musculoskeletal:  Negative for back pain and gait problem.  Neurological:  Positive for dizziness. Negative for tremors, seizures, syncope, facial asymmetry, speech difficulty, weakness, light-headedness, numbness and headaches.  Psychiatric/Behavioral:  Negative for behavioral problems and confusion.    Vital Signs: BP 129/80    Pulse (!) 59    Temp 98 F (36.7 C) (Oral)    Resp 16    Ht 5\' 2"  (1.575 m)    Wt 130 lb (59 kg)    LMP 01/08/2014    SpO2 100%    BMI 23.78 kg/m   Physical Exam Vitals reviewed.  HENT:     Mouth/Throat:     Mouth: Mucous  membranes are moist.  Eyes:     Extraocular Movements: Extraocular movements intact.  Cardiovascular:     Rate and Rhythm: Normal rate and regular rhythm.     Heart sounds: Normal heart sounds.  Pulmonary:     Effort: Pulmonary effort is normal.     Breath sounds: Normal breath sounds.  Abdominal:     Palpations: Abdomen is soft.     Tenderness: There is no abdominal tenderness.  Musculoskeletal:        General: Normal range of motion.     Right lower leg: No edema.     Left lower leg: No edema.  Skin:    General: Skin is warm.  Neurological:     Mental Status: She is alert and oriented to person, place, and time.  Psychiatric:        Mood  and Affect: Mood normal.        Behavior: Behavior normal.        Thought Content: Thought content normal.        Judgment: Judgment normal.    Imaging: MR BRAIN W WO CONTRAST  Result Date: 01/06/2022 CLINICAL DATA:  Ataxia.  Rule out stroke. EXAM: MRI HEAD WITHOUT AND WITH CONTRAST TECHNIQUE: Multiplanar, multiecho pulse sequences of the brain and surrounding structures were obtained without and with intravenous contrast. CONTRAST:  30mL GADAVIST GADOBUTROL 1 MMOL/ML IV SOLN COMPARISON:  MRI head 10/02/2020 FINDINGS: Brain: Negative for acute infarct. Ventricle size and cerebral volume normal. T2 hyperintensity in the head of the caudate on the right with evidence of prior hemorrhage unchanged from the prior study. Small focus of enhancement within the lesion on the prior study is not identified today. Few small cerebral white matter hyperintensities bilaterally. Enhancing vascular branching structure in the right basal ganglia unchanged compatible with developmental venous anomaly. Vascular: Normal arterial flow voids.  Normal venous enhancement. Skull and upper cervical spine: No focal skeletal lesion. Sinuses/Orbits: Paranasal sinuses clear.  Negative orbit Other: None IMPRESSION: Negative for acute infarct Chronic hemorrhage in the head of caudate  on the right. Possible chronic hemorrhagic infarct versus cavernoma. Adjacent developmental venous anomaly in the right basal ganglia. Electronically Signed   By: Franchot Gallo M.D.   On: 01/06/2022 19:02   IR Radiologist Eval & Mgmt  Result Date: 01/15/2022 EXAM: NEW PATIENT OFFICE VISIT CHIEF COMPLAINT: Dizziness and unsteadiness. Current Pain Level: 1-10 HISTORY OF PRESENT ILLNESS: 61 year old lady with history of high blood pressure, hyperlipidemia and left internal carotid artery stenosis. History presenting with chief complaints of intermittent onset of dizziness and gait imbalance associated with vomiting at least once. The patient has had a few of these episodes where she describes feeling lightheaded associated with difficulty with balance. Episodes last for a few minutes to an hour. During the spells, the patient reports no loss of awareness, or of diplopia, blurred vision, blindness, speech difficulties, paresthesias of the extremities, or of weakness in the extremities. No spells of tinnitus pulsatile or otherwise, or retromastoid pain. She believes her hearing is normal. She denies any symptoms of dysphagia, abdominal pain, esophageal reflux, constipation, diarrhea or melena. She denies any symptoms of dysuria, hematuria or polyuria. She denies any chest pain, shortness of breath, palpitations, pedal edema, paroxysmal nocturnal dyspnea, or of wheezing, coughing or hemoptysis. Her appetite is normal. Her weight is steady. No recent chills, fever or rigors. Diagnosis * : Date . * : Carotid artery disease ([place]) * : . * : COVID * : . * : History of kidney stones * : . * : Hyperlipidemia * : . * : Hypertension * : Past Surgical History: Procedure * : Laterality * : Date . * : COLONOSCOPY WITH PROPOFOL * : N/A * : 04/09/2021 * : Procedure: COLONOSCOPY WITH PROPOFOL; Surgeon: Harvel Quale, MD; Location[facility]; Service: Gastroenterology; Laterality: N/A; 8:45 . * : COLPOSCOPY * : * : . *  : GYNECOLOGIC CRYOSURGERY * : * : college * : paps normal since . * : POLYPECTOMY * : * : 04/09/2021 * : Procedure: POLYPECTOMY INTESTINAL; Surgeon: Montez Morita, Quillian Quince, MD; Location: [facility]; Service: Gastroenterology;; . * : TUBAL LIGATION * : * : Home Medications Prior to Admission medications Medication * : Sig * : Start Date * : End Date * : Taking? * : Authorizing Provider acetaminophen (TYLENOL) 325 MG tablet * :  Take 650 mg by mouth every 6 (six) hours as needed (for pain.). * : * : * : * : [provider] amLODipine (NORVASC) 5 MG tablet * : Take 1 tablet (5 mg total) by mouth daily. * : 09/18/21 * : 09/18/22 * : * : Lelon Perla, MD Cholecalciferol 250 MCG (10000 UT) CAPS * : Take 10,000 Units by mouth in the morning. * : * : * : * : [provider] estradiol (ESTRACE) 0.5 MG tablet * : Take 1 tablet (0.5 mg total) by mouth in the morning. * : 05/14/21 * : * : * : Gosrani, Nimish C, MD Ibuprofen (ADVIL PO) * : Take by mouth as needed. * : * : * : * : [provider] ibuprofen (ADVIL) 200 MG tablet * : Take 200 mg by mouth every 8 (eight) hours as needed (for pain.). * : * : * : * : [provider] progesterone (PROMETRIUM) 200 MG capsule * : Take 1 capsule (200 mg total) by mouth daily. * : 05/14/21 * : * : * : Gosrani, Nimish C, MD rosuvastatin (CRESTOR) 40 MG tablet * : Take 1 tablet (40 mg total) by mouth daily. Patient taking differently: Take 40 mg by mouth daily with lunch. * : 03/25/21 * : * : * : Lelon Perla, MD Allergies:  Patient has no known allergies. Social History: Nonsmoker. Occasional alcohol. Patient is an Therapist, sports at [employer]. Family History: Hypertension. REVIEW OF SYSTEMS: Negative unless as mentioned above. PHYSICAL EXAMINATION: Appears alert, awake, oriented to time, place, space. In no acute distress. Normal eye contact. Affect appropriate. Speech and comprehension normal. Grossly no lateralizing abnormal neurological features. Station  and gait within normal limits. ASSESSMENT AND PLAN: Patient's recent MRI of the brain was reviewed. This demonstrates a focal area of hemorrhage in the right caudate head. No other gross abnormality is noted. The flow voids are maintained in the major vessels visualized on the MRI of the brain. The patient's symptoms may represent symptoms related to a posterior circulation TIA. Middle ear disease may also be a possible etiology. For further evaluation, assessment of the extracranial and intracranial circulation either with a CT angiogram of the head and neck or more accurately with a diagnostic catheter arteriogram would be indicated. The diagnostic catheter arteriogram was described in detail. This would allow more accurate hemodynamic and neuro anatomic angiographic assessment extra cranially and intracranially. Procedure was described with an extremely low risk of a potential complication. Patient was described the procedure would be via radial right, or femoral route. It will be day procedure. Patient would return home 3-4 hours after the procedure. Patient would like to proceed with a diagnostic catheter arteriogram which will be scheduled as soon as possible. Patient was asked to maintain adequate hydration. Should she have any concerns or questions she was advised to call. Electronically Signed   By: Luanne Bras M.D.   On: 01/15/2022 08:28    Labs:  CBC: Recent Labs    01/06/22 1728 01/29/22 0655  WBC 9.3 8.2  HGB 14.8 14.2  HCT 43.0 41.3  PLT 211 226    COAGS: Recent Labs    01/29/22 0655  INR 0.9    BMP: Recent Labs    01/06/22 1728 01/29/22 0655  NA 138 140  K 3.4* 4.6  CL 101 106  CO2 24 26  GLUCOSE 120* 98  BUN 11 14  CALCIUM 9.5 9.1  CREATININE 0.68 0.66  GFRNONAA >60 >60    LIVER FUNCTION TESTS: Recent Labs    07/09/21 0910  BILITOT 0.6  AST 25  ALT 26  ALKPHOS 87  PROT 7.4  ALBUMIN 4.9    TUMOR MARKERS: No results for input(s): AFPTM, CEA,  CA199, CHROMGRNA in the last 8760 hours.  Assessment and Plan:  Scheduled for Cerebral arteriogram Risks and benefits of cerebral angiogram with intervention were discussed with the patient including, but not limited to bleeding, infection, vascular injury, contrast induced renal failure, stroke or even death.  This interventional procedure involves the use of X-rays and because of the nature of the planned procedure, it is possible that we will have prolonged use of X-ray fluoroscopy.  Potential radiation risks to you include (but are not limited to) the following: - A slightly elevated risk for cancer  several years later in life. This risk is typically less than 0.5% percent. This risk is low in comparison to the normal incidence of human cancer, which is 33% for women and 50% for men according to the [place]. - Radiation induced injury can include skin redness, resembling a rash, tissue breakdown / ulcers and hair loss (which can be temporary or permanent).   The likelihood of either of these occurring depends on the difficulty of the procedure and whether you are sensitive to radiation due to previous procedures, disease, or genetic conditions.   IF your procedure requires a prolonged use of radiation, you will be notified and given written instructions for further action.  It is your responsibility to monitor the irradiated area for the 2 weeks following the procedure and to notify your physician if you are concerned that you have suffered a radiation induced injury.    All of the patient's questions were answered, patient is agreeable to proceed.  Consent signed and in chart.    Thank you for this interesting consult.  I greatly enjoyed meeting Melanie Howard and look forward to participating in their care.  A copy of this report was sent to the requesting provider on this date.  Electronically Signed: Lavonia Drafts, PA-C 01/29/2022, 7:33 AM   I spent a  total of  30 Minutes   in face to face in clinical consultation, greater than 50% of which was counseling/coordinating care for cerebral arteriogram

## 2022-01-29 NOTE — Procedures (Signed)
INR.  4 Vessel cerebral arteriogram RT rad approach. Findings. 1.Mild FMD like changes in the distal Rt VA at C1/C2,and Lt ICA prox associated with a small focal outpouching. 2. High riding RT jugular bulb  measuring approx 64mm x 9.51mm  3. Stenosis of Rt  IJV  of approx 60 to 70 %. S.Shadae Reino MD

## 2022-01-29 NOTE — Discharge Instructions (Signed)

## 2022-02-04 ENCOUNTER — Other Ambulatory Visit: Payer: Self-pay

## 2022-02-04 ENCOUNTER — Ambulatory Visit: Payer: 59 | Admitting: Internal Medicine

## 2022-02-04 ENCOUNTER — Other Ambulatory Visit (HOSPITAL_COMMUNITY): Payer: Self-pay

## 2022-02-04 ENCOUNTER — Encounter: Payer: Self-pay | Admitting: Internal Medicine

## 2022-02-04 VITALS — BP 134/76 | HR 60 | Temp 98.0°F | Resp 16 | Ht 62.0 in | Wt 134.0 lb

## 2022-02-04 DIAGNOSIS — I10 Essential (primary) hypertension: Secondary | ICD-10-CM

## 2022-02-04 DIAGNOSIS — Z23 Encounter for immunization: Secondary | ICD-10-CM | POA: Diagnosis not present

## 2022-02-04 DIAGNOSIS — Z0000 Encounter for general adult medical examination without abnormal findings: Secondary | ICD-10-CM

## 2022-02-04 DIAGNOSIS — E785 Hyperlipidemia, unspecified: Secondary | ICD-10-CM | POA: Diagnosis not present

## 2022-02-04 DIAGNOSIS — Z1231 Encounter for screening mammogram for malignant neoplasm of breast: Secondary | ICD-10-CM | POA: Insufficient documentation

## 2022-02-04 LAB — LIPID PANEL
Cholesterol: 120 mg/dL (ref 0–200)
HDL: 57.1 mg/dL (ref >39.00)
LDL Cholesterol: 49 mg/dL (ref 0–99)
NonHDL: 63.2
Total CHOL/HDL Ratio: 2
Triglycerides: 71 mg/dL (ref 0.0–149.0)
VLDL: 14.2 mg/dL (ref 0.0–40.0)

## 2022-02-04 NOTE — Progress Notes (Signed)
? ?Subjective:  ?Patient ID: Melanie Howard, female    DOB: [birth year]  Age: 61 y.o. MRN: [id number] ? ?CC: Annual Exam and Hypertension ? ?This visit occurred during the SARS-CoV-2 public health emergency.  Safety protocols were in place, including screening questions prior to the visit, additional usage of staff PPE, and extensive cleaning of exam room while observing appropriate contact time as indicated for disinfecting solutions.   ? ?HPI ?Melanie Howard presents for a CPX and to establish. ? ?She has recently had a thorough neurological and cardiac work-up.  She continues to have intermittent headaches.  She tells me her blood pressure is well controlled.  She is active and denies chest pain, shortness of breath, diaphoresis, dizziness, lightheadedness, or edema. ? ?History ?Melanie Howard has a past medical history of Carotid artery disease ([place]), COVID, History of kidney stones, Hyperlipidemia, and Hypertension.  ? ?She has a past surgical history that includes Colposcopy; Tubal ligation; Gynecologic cryosurgery (college); Colonoscopy with propofol (N/A, 04/09/2021); Polypectomy (04/09/2021); IR Radiologist Eval & Mgmt (01/15/2022); IR ANGIO INTRA EXTRACRAN SEL COM CAROTID INNOMINATE BILAT MOD SED (01/29/2022); IR US Guide Vasc Access Right (01/29/2022); and IR ANGIO VERTEBRAL SEL VERTEBRAL BILAT MOD SED (01/29/2022).  ? ?Her family history includes Breast cancer in her maternal grandmother; Depression in her sister; Heart disease in her father; Hypertension in her brother, father, and mother; Restless legs syndrome in her sister.She reports that she has never smoked. She has never used smokeless tobacco. She reports current alcohol use. She reports that she does not use drugs. ? ?Outpatient Medications Prior to Visit  ?Medication Sig Dispense Refill  ? acetaminophen (TYLENOL) 500 MG tablet Take 1,000 mg by mouth every 6 (six) hours as needed for moderate pain.    ? amLODipine (NORVASC) 5 MG tablet Take 1 tablet (5  mg total) by mouth daily. 90 tablet 3  ? rosuvastatin (CRESTOR) 40 MG tablet Take 1 tablet (40 mg total) by mouth daily. (Patient taking differently: Take 40 mg by mouth daily with lunch.) 90 tablet 3  ? VITAMIN D PO Take 1 capsule by mouth daily.    ? estradiol (ESTRACE) 0.5 MG tablet Take 1 tablet (0.5 mg total) by mouth in the morning. (Patient not taking: Reported on 01/27/2022) 90 tablet 1  ? progesterone (PROMETRIUM) 200 MG capsule Take 1 capsule (200 mg total) by mouth daily. (Patient not taking: Reported on 01/27/2022) 90 capsule 1  ? ?No facility-administered medications prior to visit.  ? ? ?ROS ?Review of Systems  ?Constitutional:  Negative for diaphoresis and fatigue.  ?HENT: Negative.    ?Eyes: Negative.   ?Respiratory:  Negative for cough, chest tightness, shortness of breath and wheezing.   ?Cardiovascular:  Negative for chest pain, palpitations and leg swelling.  ?Gastrointestinal:  Negative for abdominal pain, constipation, diarrhea, nausea and vomiting.  ?Endocrine: Negative.   ?Genitourinary: Negative.  Negative for difficulty urinating.  ?Musculoskeletal: Negative.  Negative for myalgias.  ?Skin: Negative.   ?Neurological:  Positive for dizziness and headaches. Negative for weakness and numbness.  ?Hematological:  Negative for adenopathy. Does not bruise/bleed easily.  ?Psychiatric/Behavioral: Negative.    ? ?Objective:  ?BP 134/76 (BP Location: Left Arm, Patient Position: Sitting, Cuff Size: Large)   Pulse 60   Temp 98 ?F (36.7 ?C) (Oral)   Resp 16   Ht 5\' 2"  (1.575 m)   Wt 134 lb (60.8 kg)   LMP 01/08/2014   SpO2 98%   BMI 24.51 kg/m?  ? ?Physical Exam ?Vitals reviewed.  ?  HENT:  ?   Nose: Nose normal.  ?   Mouth/Throat:  ?   Mouth: Mucous membranes are moist.  ?Eyes:  ?   Conjunctiva/sclera: Conjunctivae normal.  ?Cardiovascular:  ?   Rate and Rhythm: Normal rate and regular rhythm.  ?   Heart sounds: No murmur heard. ?  No gallop.  ?Pulmonary:  ?   Effort: Pulmonary effort is normal.  ?    Breath sounds: No stridor. No wheezing, rhonchi or rales.  ?Abdominal:  ?   General: Abdomen is flat.  ?   Palpations: There is no mass.  ?   Tenderness: There is no abdominal tenderness. There is no guarding.  ?   Hernia: No hernia is present.  ?Musculoskeletal:     ?   General: Normal range of motion.  ?   Right lower leg: No edema.  ?   Left lower leg: No edema.  ?Skin: ?   General: Skin is warm and dry.  ?   Coloration: Skin is not pale.  ?Neurological:  ?   General: No focal deficit present.  ?   Mental Status: She is alert. Mental status is at baseline.  ?Psychiatric:     ?   Mood and Affect: Mood normal.     ?   Behavior: Behavior normal.  ? ? ?Lab Results  ?Component Value Date  ? WBC 8.2 01/29/2022  ? HGB 14.2 01/29/2022  ? HCT 41.3 01/29/2022  ? PLT 226 01/29/2022  ? GLUCOSE 98 01/29/2022  ? CHOL 120 02/04/2022  ? TRIG 71.0 02/04/2022  ? HDL 57.10 02/04/2022  ? LDLCALC 49 02/04/2022  ? ALT 26 07/09/2021  ? AST 25 07/09/2021  ? NA 140 01/29/2022  ? K 4.6 01/29/2022  ? CL 106 01/29/2022  ? CREATININE 0.66 01/29/2022  ? BUN 14 01/29/2022  ? CO2 26 01/29/2022  ? TSH 1.958 01/06/2022  ? INR 0.9 01/29/2022  ?  ? ?Assessment & Plan:  ? ?Melanie Howard was seen today for annual exam and hypertension. ? ?Diagnoses and all orders for this visit: ? ?Primary hypertension- Her blood pressure is adequately well controlled. ? ?Hyperlipidemia with target LDL less than 100- LDL goal achieved. Doing well on the statin  ?-     Lipid panel; Future ?-     Lipid panel ? ?Routine general medical examination at a health care facility- Exam completed, labs reviewed, vaccines reviewed and updated, cancer screenings addressed, patient education was given. ? ?Visit for screening mammogram ?-     MM DIGITAL SCREENING BILATERAL; Future ? ?Other orders ?-     Tdap vaccine greater than or equal to 7yo IM ?-     Varicella-zoster vaccine IM (Shingrix) ? ? ?I have discontinued Melanie Howard's estradiol and progesterone. I am also having her  maintain her rosuvastatin, amLODipine, VITAMIN D PO, and acetaminophen. ? ?No orders of the defined types were placed in this encounter. ? ? ? ?Follow-up: Return in about 6 months (around 08/07/2022). ? ?Scarlette Calico, MD ?

## 2022-02-04 NOTE — Patient Instructions (Signed)

## 2022-03-11 ENCOUNTER — Ambulatory Visit
Admission: RE | Admit: 2022-03-11 | Discharge: 2022-03-11 | Disposition: A | Discharge status: Home / Self Care | Payer: 59 | Source: Ambulatory Visit | Attending: Internal Medicine | Admitting: Internal Medicine

## 2022-03-11 DIAGNOSIS — Z1231 Encounter for screening mammogram for malignant neoplasm of breast: Secondary | ICD-10-CM

## 2022-03-17 ENCOUNTER — Other Ambulatory Visit (HOSPITAL_COMMUNITY): Payer: Self-pay

## 2022-05-13 ENCOUNTER — Ambulatory Visit (INDEPENDENT_AMBULATORY_CARE_PROVIDER_SITE_OTHER): Payer: 59

## 2022-05-13 DIAGNOSIS — Z23 Encounter for immunization: Secondary | ICD-10-CM

## 2022-06-10 ENCOUNTER — Other Ambulatory Visit (HOSPITAL_COMMUNITY): Payer: Self-pay

## 2022-06-10 ENCOUNTER — Other Ambulatory Visit: Payer: Self-pay | Admitting: Cardiology

## 2022-06-10 MED ORDER — ROSUVASTATIN CALCIUM 40 MG PO TABS
40.0000 mg | ORAL_TABLET | Freq: Every day | ORAL | 0 refills | Status: DC
  Filled 2022-06-10: qty 30, 30d supply, fill #0

## 2022-06-16 ENCOUNTER — Other Ambulatory Visit (HOSPITAL_COMMUNITY): Payer: Self-pay

## 2022-07-13 ENCOUNTER — Other Ambulatory Visit: Payer: Self-pay | Admitting: Cardiology

## 2022-07-14 ENCOUNTER — Other Ambulatory Visit (HOSPITAL_COMMUNITY): Payer: Self-pay

## 2022-07-14 MED ORDER — ROSUVASTATIN CALCIUM 40 MG PO TABS
40.0000 mg | ORAL_TABLET | Freq: Every day | ORAL | 3 refills | Status: DC
  Filled 2022-07-14: qty 90, 90d supply, fill #0
  Filled 2022-12-23: qty 90, 90d supply, fill #1
  Filled 2023-03-21: qty 90, 90d supply, fill #2
  Filled 2023-07-13: qty 90, 90d supply, fill #3

## 2022-07-22 ENCOUNTER — Other Ambulatory Visit (HOSPITAL_COMMUNITY): Payer: Self-pay | Admitting: Interventional Radiology

## 2022-08-12 ENCOUNTER — Encounter: Payer: Self-pay | Admitting: Internal Medicine

## 2022-08-12 ENCOUNTER — Ambulatory Visit: Payer: 59 | Admitting: Internal Medicine

## 2022-08-12 VITALS — BP 124/76 | HR 69 | Temp 98.1°F | Ht 62.0 in | Wt 130.0 lb

## 2022-08-12 DIAGNOSIS — E785 Hyperlipidemia, unspecified: Secondary | ICD-10-CM

## 2022-08-12 DIAGNOSIS — I10 Essential (primary) hypertension: Secondary | ICD-10-CM

## 2022-08-12 LAB — BASIC METABOLIC PANEL
BUN: 13 mg/dL (ref 6–23)
CO2: 28 mEq/L (ref 19–32)
Calcium: 9.3 mg/dL (ref 8.4–10.5)
Chloride: 104 mEq/L (ref 96–112)
Creatinine, Ser: 0.71 mg/dL (ref 0.40–1.20)
GFR: 92.32 mL/min (ref >60.00)
Glucose, Bld: 97 mg/dL (ref 70–99)
Potassium: 3.7 mEq/L (ref 3.5–5.1)
Sodium: 140 mEq/L (ref 135–145)

## 2022-08-12 LAB — HEPATIC FUNCTION PANEL
ALT: 25 U/L (ref 0–35)
AST: 22 U/L (ref 0–37)
Albumin: 4.3 g/dL (ref 3.5–5.2)
Alkaline Phosphatase: 75 U/L (ref 39–117)
Bilirubin, Direct: 0.2 mg/dL (ref 0.0–0.3)
Total Bilirubin: 0.7 mg/dL (ref 0.2–1.2)
Total Protein: 7.1 g/dL (ref 6.0–8.3)

## 2022-08-12 NOTE — Patient Instructions (Signed)
Hypertension, Adult High blood pressure (hypertension) is when the force of blood pumping through the arteries is too strong. The arteries are the blood vessels that carry blood from the heart throughout the body. Hypertension forces the heart to work harder to pump blood and may cause arteries to become narrow or stiff. Untreated or uncontrolled hypertension can lead to a heart attack, heart failure, a stroke, kidney disease, and other problems. A blood pressure reading consists of a higher number over a lower number. Ideally, your blood pressure should be below 120/80. The first ("top") number is called the systolic pressure. It is a measure of the pressure in your arteries as your heart beats. The second ("bottom") number is called the diastolic pressure. It is a measure of the pressure in your arteries as the heart relaxes. What are the causes? The exact cause of this condition is not known. There are some conditions that result in high blood pressure. What increases the risk? Certain factors may make you more likely to develop high blood pressure. Some of these risk factors are under your control, including: Smoking. Not getting enough exercise or physical activity. Being overweight. Having too much fat, sugar, calories, or salt (sodium) in your diet. Drinking too much alcohol. Other risk factors include: Having a personal history of heart disease, diabetes, high cholesterol, or kidney disease. Stress. Having a family history of high blood pressure and high cholesterol. Having obstructive sleep apnea. Age. The risk increases with age. What are the signs or symptoms? High blood pressure may not cause symptoms. Very high blood pressure (hypertensive crisis) may cause: Headache. Fast or irregular heartbeats (palpitations). Shortness of breath. Nosebleed. Nausea and vomiting. Vision changes. Severe chest pain, dizziness, and seizures. How is this diagnosed? This condition is diagnosed by  measuring your blood pressure while you are seated, with your arm resting on a flat surface, your legs uncrossed, and your feet flat on the floor. The cuff of the blood pressure monitor will be placed directly against the skin of your upper arm at the level of your heart. Blood pressure should be measured at least twice using the same arm. Certain conditions can cause a difference in blood pressure between your right and left arms. If you have a high blood pressure reading during one visit or you have normal blood pressure with other risk factors, you may be asked to: Return on a different day to have your blood pressure checked again. Monitor your blood pressure at home for 1 week or longer. If you are diagnosed with hypertension, you may have other blood or imaging tests to help your health care provider understand your overall risk for other conditions. How is this treated? This condition is treated by making healthy lifestyle changes, such as eating healthy foods, exercising more, and reducing your alcohol intake. You may be referred for counseling on a healthy diet and physical activity. Your health care provider may prescribe medicine if lifestyle changes are not enough to get your blood pressure under control and if: Your systolic blood pressure is above 130. Your diastolic blood pressure is above 80. Your personal target blood pressure may vary depending on your medical conditions, your age, and other factors. Follow these instructions at home: Eating and drinking  Eat a diet that is high in fiber and potassium, and low in sodium, added sugar, and fat. An example of this eating plan is called the DASH diet. DASH stands for Dietary Approaches to Stop Hypertension. To eat this way: Eat   plenty of fresh fruits and vegetables. Try to fill one half of your plate at each meal with fruits and vegetables. Eat whole grains, such as whole-wheat pasta, brown rice, or whole-grain bread. Fill about one  fourth of your plate with whole grains. Eat or drink low-fat dairy products, such as skim milk or low-fat yogurt. Avoid fatty cuts of meat, processed or cured meats, and poultry with skin. Fill about one fourth of your plate with lean proteins, such as fish, chicken without skin, beans, eggs, or tofu. Avoid pre-made and processed foods. These tend to be higher in sodium, added sugar, and fat. Reduce your daily sodium intake. Many people with hypertension should eat less than 1,500 mg of sodium a day. Do not drink alcohol if: Your health care provider tells you not to drink. You are pregnant, may be pregnant, or are planning to become pregnant. If you drink alcohol: Limit how much you have to: 0-1 drink a day for women. 0-2 drinks a day for men. Know how much alcohol is in your drink. In the [place] one drink equals one 12 oz bottle of beer (355 mL), one 5 oz glass of wine (148 mL), or one 1 oz glass of hard liquor (44 mL). Lifestyle  Work with your health care provider to maintain a healthy body weight or to lose weight. Ask what an ideal weight is for you. Get at least 30 minutes of exercise that causes your heart to beat faster (aerobic exercise) most days of the week. Activities may include walking, swimming, or biking. Include exercise to strengthen your muscles (resistance exercise), such as Pilates or lifting weights, as part of your weekly exercise routine. Try to do these types of exercises for 30 minutes at least 3 days a week. Do not use any products that contain nicotine or tobacco. These products include cigarettes, chewing tobacco, and vaping devices, such as e-cigarettes. If you need help quitting, ask your health care provider. Monitor your blood pressure at home as told by your health care provider. Keep all follow-up visits. This is important. Medicines Take over-the-counter and prescription medicines only as told by your health care provider. Follow directions carefully. Blood  pressure medicines must be taken as prescribed. Do not skip doses of blood pressure medicine. Doing this puts you at risk for problems and can make the medicine less effective. Ask your health care provider about side effects or reactions to medicines that you should watch for. Contact a health care provider if you: Think you are having a reaction to a medicine you are taking. Have headaches that keep coming back (recurring). Feel dizzy. Have swelling in your ankles. Have trouble with your vision. Get help right away if you: Develop a severe headache or confusion. Have unusual weakness or numbness. Feel faint. Have severe pain in your chest or abdomen. Vomit repeatedly. Have trouble breathing. These symptoms may be an emergency. Get help right away. Call 911. Do not wait to see if the symptoms will go away. Do not drive yourself to the hospital. Summary Hypertension is when the force of blood pumping through your arteries is too strong. If this condition is not controlled, it may put you at risk for serious complications. Your personal target blood pressure may vary depending on your medical conditions, your age, and other factors. For most people, a normal blood pressure is less than 120/80. Hypertension is treated with lifestyle changes, medicines, or a combination of both. Lifestyle changes include losing weight, eating a healthy,   low-sodium diet, exercising more, and limiting alcohol. This information is not intended to replace advice given to you by your health care provider. Make sure you discuss any questions you have with your health care provider. Document Revised: 09/30/2021 Document Reviewed: 09/30/2021 Elsevier Patient Education  2023 [employer] Inc.  

## 2022-08-12 NOTE — Progress Notes (Signed)
Subjective:  Patient ID: Melanie Howard, female    DOB: 03-29-1961  Age: 61 y.o. MRN: [id number]  CC: Hypertension   HPI Melanie Howard presents for f/up -  She complains of intermittent swelling and red spots over both ankles.  She is active and denies chest pain, shortness of breath, diaphoresis, or edema.  Outpatient Medications Prior to Visit  Medication Sig Dispense Refill   acetaminophen (TYLENOL) 500 MG tablet Take 1,000 mg by mouth every 6 (six) hours as needed for moderate pain.     amLODipine (NORVASC) 5 MG tablet Take 1 tablet (5 mg total) by mouth daily. 90 tablet 3   rosuvastatin (CRESTOR) 40 MG tablet Take 1 tablet (40 mg total) by mouth daily. 90 tablet 3   VITAMIN D PO Take 1 capsule by mouth daily.     No facility-administered medications prior to visit.    ROS Review of Systems  Constitutional: Negative.  Negative for diaphoresis, fatigue and unexpected weight change.  HENT: Negative.    Eyes: Negative.   Respiratory:  Negative for cough, chest tightness, shortness of breath and wheezing.   Cardiovascular:  Positive for leg swelling. Negative for chest pain and palpitations.  Gastrointestinal:  Negative for abdominal pain, diarrhea, nausea and vomiting.  Endocrine: Negative.   Genitourinary: Negative.  Negative for difficulty urinating.  Musculoskeletal: Negative.  Negative for arthralgias and myalgias.  Skin: Negative.   Allergic/Immunologic: Negative.   Neurological: Negative.  Negative for dizziness and weakness.  Hematological:  Negative for adenopathy. Does not bruise/bleed easily.  Psychiatric/Behavioral: Negative.      Objective:  BP 124/76 (BP Location: Left Arm, Patient Position: Sitting, Cuff Size: Large)   Pulse 69   Temp 98.1 F (36.7 C) (Oral)   Ht '5\' 2"'$  (1.575 m)   Wt 130 lb (59 kg)   LMP 01/08/2014   SpO2 97%   BMI 23.78 kg/m   BP Readings from Last 3 Encounters:  08/12/22 124/76  02/04/22 134/76  01/29/22 110/65     Wt Readings from Last 3 Encounters:  08/12/22 130 lb (59 kg)  02/04/22 134 lb (60.8 kg)  01/29/22 130 lb (59 kg)    Physical Exam Vitals reviewed.  Constitutional:      Appearance: She is not ill-appearing.  HENT:     Mouth/Throat:     Mouth: Mucous membranes are moist.  Eyes:     General: No scleral icterus.    Conjunctiva/sclera: Conjunctivae normal.  Cardiovascular:     Rate and Rhythm: Normal rate and regular rhythm.     Heart sounds: No murmur heard. Pulmonary:     Effort: Pulmonary effort is normal.     Breath sounds: No stridor. No wheezing, rhonchi or rales.  Abdominal:     General: Abdomen is flat.     Palpations: There is no mass.     Tenderness: There is no abdominal tenderness. There is no guarding.     Hernia: No hernia is present.  Musculoskeletal:        General: No swelling.     Cervical back: Neck supple.     Right lower leg: No edema.     Left lower leg: No edema.  Lymphadenopathy:     Cervical: No cervical adenopathy.  Skin:    General: Skin is warm.     Findings: Erythema present.     Comments: There is no edema today but there are faint, tiny, erythematous papules around both ankles.  Neurological:  General: No focal deficit present.     Mental Status: She is alert.  Psychiatric:        Mood and Affect: Mood normal.        Behavior: Behavior normal.     Lab Results  Component Value Date   WBC 8.2 01/29/2022   HGB 14.2 01/29/2022   HCT 41.3 01/29/2022   PLT 226 01/29/2022   GLUCOSE 97 08/12/2022   CHOL 120 02/04/2022   TRIG 71.0 02/04/2022   HDL 57.10 02/04/2022   LDLCALC 49 02/04/2022   ALT 25 08/12/2022   AST 22 08/12/2022   NA 140 08/12/2022   K 3.7 08/12/2022   CL 104 08/12/2022   CREATININE 0.71 08/12/2022   BUN 13 08/12/2022   CO2 28 08/12/2022   TSH 1.958 01/06/2022   INR 0.9 01/29/2022    MM 3D SCREEN BREAST BILATERAL  Result Date: 03/11/2022 CLINICAL DATA:  Screening. EXAM: DIGITAL SCREENING BILATERAL  MAMMOGRAM WITH TOMOSYNTHESIS AND CAD TECHNIQUE: Bilateral screening digital craniocaudal and mediolateral oblique mammograms were obtained. Bilateral screening digital breast tomosynthesis was performed. The images were evaluated with computer-aided detection. COMPARISON:  Previous exam(s). ACR Breast Density Category c: The breast tissue is heterogeneously dense, which may obscure small masses. FINDINGS: There are no findings suspicious for malignancy. IMPRESSION: No mammographic evidence of malignancy. A result letter of this screening mammogram will be mailed directly to the patient. RECOMMENDATION: Screening mammogram in one year. (Code:SM-B-01Y) BI-RADS CATEGORY  1: Negative. Electronically Signed   By: Dorise Bullion III M.D.   On: 03/11/2022 13:31    Assessment & Plan:   Michelina was seen today for hypertension.  Diagnoses and all orders for this visit:  Primary hypertension- Her blood pressure is overcontrolled and she has lower extremity symptoms that I think are related to the CCB.  I recommended that she stop amlodipine to see if her symptoms improve. -     Basic metabolic panel; Future -     Basic metabolic panel  Hyperlipidemia with target LDL less than 100- LDL goal achieved. Doing well on the statin  -     Hepatic function panel; Future -     Hepatic function panel   I am having Melanie Howard maintain her amLODipine, VITAMIN D PO, acetaminophen, and rosuvastatin.  No orders of the defined types were placed in this encounter.    Follow-up: Return in about 6 months (around 02/10/2023).  Scarlette Calico, MD

## 2022-08-19 ENCOUNTER — Other Ambulatory Visit (HOSPITAL_COMMUNITY): Payer: Self-pay | Admitting: Interventional Radiology

## 2022-08-19 DIAGNOSIS — I771 Stricture of artery: Secondary | ICD-10-CM

## 2022-08-19 DIAGNOSIS — R519 Headache, unspecified: Secondary | ICD-10-CM

## 2022-08-19 DIAGNOSIS — R42 Dizziness and giddiness: Secondary | ICD-10-CM

## 2022-09-09 ENCOUNTER — Ambulatory Visit (HOSPITAL_COMMUNITY)
Admission: RE | Admit: 2022-09-09 | Discharge: 2022-09-09 | Disposition: A | Discharge status: Home / Self Care | Payer: 59 | Source: Ambulatory Visit | Attending: Interventional Radiology | Admitting: Interventional Radiology

## 2022-09-09 DIAGNOSIS — I771 Stricture of artery: Secondary | ICD-10-CM | POA: Diagnosis not present

## 2022-09-09 DIAGNOSIS — R42 Dizziness and giddiness: Secondary | ICD-10-CM | POA: Insufficient documentation

## 2022-09-09 DIAGNOSIS — R519 Headache, unspecified: Secondary | ICD-10-CM | POA: Insufficient documentation

## 2022-09-09 DIAGNOSIS — G9389 Other specified disorders of brain: Secondary | ICD-10-CM | POA: Diagnosis not present

## 2022-09-09 MED ORDER — GADOBUTROL 1 MMOL/ML IV SOLN
6.0000 mL | Freq: Once | INTRAVENOUS | Status: AC | PRN
  Administered 2022-09-09: 6 mL via INTRAVENOUS

## 2022-09-16 ENCOUNTER — Telehealth (HOSPITAL_COMMUNITY): Payer: Self-pay

## 2022-09-16 NOTE — Telephone Encounter (Signed)
Pt agreed to f/u in 1 year with a cta head/neck. AW

## 2022-10-07 ENCOUNTER — Ambulatory Visit (HOSPITAL_COMMUNITY)
Admission: RE | Admit: 2022-10-07 | Discharge: 2022-10-07 | Disposition: A | Discharge status: Home / Self Care | Payer: 59 | Source: Ambulatory Visit | Attending: Cardiology | Admitting: Cardiology

## 2022-10-07 DIAGNOSIS — I6522 Occlusion and stenosis of left carotid artery: Secondary | ICD-10-CM | POA: Insufficient documentation

## 2022-10-12 ENCOUNTER — Encounter: Payer: Self-pay | Admitting: *Deleted

## 2022-10-16 ENCOUNTER — Ambulatory Visit: Payer: 59 | Admitting: Cardiology

## 2022-10-23 NOTE — Progress Notes (Signed)
HPI: FU carotid artery disease and hypertension. Calcium score May 2022 0. Carotid Dopplers November 2023 showed no stenosis on the right and 40 to 59% stenosis on the left.  Follow-up recommended 24 months.  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  Since last seen she denies dyspnea, chest pain or syncope.  Her blood pressure is running high.  Amlodipine was discontinued due to pedal edema.  Current Outpatient Medications  Medication Sig Dispense Refill   acetaminophen (TYLENOL) 500 MG tablet Take 1,000 mg by mouth every 6 (six) hours as needed for moderate pain.     rosuvastatin (CRESTOR) 40 MG tablet Take 1 tablet (40 mg total) by mouth daily. 90 tablet 3   VITAMIN D PO Take 1 capsule by mouth daily.     No current facility-administered medications for this visit.     Past Medical History:  Diagnosis Date   Carotid artery disease ([place])    COVID    History of kidney stones    Hyperlipidemia    Hypertension     Past Surgical History:  Procedure Laterality Date   COLONOSCOPY WITH PROPOFOL N/A 04/09/2021   Procedure: COLONOSCOPY WITH PROPOFOL;  Surgeon: Harvel Quale, MD;  Location: [facility];  Service: Gastroenterology;  Laterality: N/A;  8:45   COLPOSCOPY     GYNECOLOGIC CRYOSURGERY  college   paps normal since   IR ANGIO INTRA EXTRACRAN SEL COM CAROTID INNOMINATE BILAT MOD SED  01/29/2022   IR ANGIO VERTEBRAL SEL VERTEBRAL BILAT MOD SED  01/29/2022   IR RADIOLOGIST EVAL & MGMT  01/15/2022   IR US GUIDE VASC ACCESS RIGHT  01/29/2022   POLYPECTOMY  04/09/2021   Procedure: POLYPECTOMY INTESTINAL;  Surgeon: Harvel Quale, MD;  Location: [facility];   Service: Gastroenterology;;   TUBAL LIGATION      Social History   Socioeconomic History   Marital status: Married    Spouse name: Not on file   Number of children: 3   Years of education: Not on file   Highest education level: Not on file  Occupational History   Not on file  Tobacco Use   Smoking status: Never   Smokeless tobacco: Never  Vaping Use   Vaping Use: Never used  Substance and Sexual Activity   Alcohol use: Yes    Comment: Rare; socially on weekends   Drug use: No   Sexual activity: Yes    Birth control/protection: Surgical    Comment: BTL-1st intercourse 61 yo-Fewer than 5 partners  Other Topics Concern   Not on file  Social History Narrative   Married for 32 years.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 ,works in [employer] in [place] Strain: Not on file  Food Insecurity: Not on file  Transportation Needs: Not on file  Physical Activity: Not on file  Stress: Not on file  Social Connections: Not on file  Intimate Partner Violence: Not on file    Family History  Problem Relation Age of Onset   Hypertension Mother    Hypertension Father    Heart disease Father    Depression Sister    Restless legs syndrome Sister    Breast cancer Maternal  Aunt 39   Breast cancer Maternal Aunt 54   Hypertension Brother     ROS: no fevers or chills, productive cough, hemoptysis, dysphasia, odynophagia, melena, hematochezia, dysuria, hematuria, rash, seizure activity, orthopnea, PND, pedal edema, claudication. Remaining systems are negative.  Physical Exam: Well-developed well-nourished in no acute distress.  Skin is warm and dry.  HEENT is normal.  Neck is supple.  Chest is clear to auscultation with normal expansion.  Cardiovascular exam is regular rate and rhythm.  Abdominal exam nontender or distended. No masses palpated. Extremities show no edema. neuro grossly intact  ECG-sinus bradycardia with no ST changes.   Personally reviewed  A/P  1 carotid artery disease-continue statin.  Plan follow-up carotid Dopplers November 2025.  2 hypertension-patient's blood pressure is elevated.  Amlodipine previously caused pedal edema.  We will begin losartan 50 mg daily.  In 1 week check potassium and renal function.  Monitor blood pressure and adjust medications as needed.  3 hyperlipidemia-continue Crestor.  4 family history of coronary artery disease-recent calcium score 0.  Kirk Ruths, MD

## 2022-11-06 ENCOUNTER — Ambulatory Visit: Payer: 59 | Attending: Cardiology | Admitting: Cardiology

## 2022-11-06 ENCOUNTER — Encounter: Payer: Self-pay | Admitting: Cardiology

## 2022-11-06 ENCOUNTER — Other Ambulatory Visit (HOSPITAL_COMMUNITY): Payer: Self-pay

## 2022-11-06 VITALS — BP 148/88 | HR 57 | Ht 62.0 in | Wt 130.4 lb

## 2022-11-06 DIAGNOSIS — I10 Essential (primary) hypertension: Secondary | ICD-10-CM

## 2022-11-06 DIAGNOSIS — I6522 Occlusion and stenosis of left carotid artery: Secondary | ICD-10-CM

## 2022-11-06 DIAGNOSIS — E7800 Pure hypercholesterolemia, unspecified: Secondary | ICD-10-CM

## 2022-11-06 MED ORDER — LOSARTAN POTASSIUM 50 MG PO TABS
50.0000 mg | ORAL_TABLET | Freq: Every day | ORAL | 3 refills | Status: DC
  Filled 2022-11-06: qty 90, 90d supply, fill #0

## 2022-11-06 NOTE — Addendum Note (Signed)
Addended by: Hinton Dyer on: 11/06/2022 02:28 PM   Modules accepted: Orders

## 2022-11-06 NOTE — Patient Instructions (Signed)
Medication Instructions:   START LOSARTAN 50 MG ONCE DAILY  *If you need a refill on your cardiac medications before your next appointment, please call your pharmacy*   Lab Work:  Your physician recommends that you return for lab work in: 1 WEEK-DO NOT NEED TO FAST  If you have labs (blood work) drawn today and your tests are completely normal, you will receive your results only by: [place] (if you have MyChart) OR A paper copy in the mail If you have any lab test that is abnormal or we need to change your treatment, we will call you to review the results.   Follow-Up: At [facility], you and your health needs are our priority.  As part of our continuing mission to provide you with exceptional heart care, we have created designated Provider Care Teams.  These Care Teams include your primary Cardiologist (physician) and Advanced Practice Providers (APPs -  Physician Assistants and Nurse Practitioners) who all work together to provide you with the care you need, when you need it.  We recommend signing up for the patient portal called "MyChart".  Sign up information is provided on this After Visit Summary.  MyChart is used to connect with patients for Virtual Visits (Telemedicine).  Patients are able to view lab/test results, encounter notes, upcoming appointments, etc.  Non-urgent messages can be sent to your provider as well.   To learn more about what you can do with MyChart, go to [email or web address].    Your next appointment:   12 month(s)  The format for your next appointment:   In Person  Provider:   Kirk Ruths MD

## 2022-11-09 ENCOUNTER — Encounter: Payer: Self-pay | Admitting: Cardiology

## 2022-11-09 DIAGNOSIS — I10 Essential (primary) hypertension: Secondary | ICD-10-CM

## 2022-11-10 ENCOUNTER — Other Ambulatory Visit (HOSPITAL_COMMUNITY): Payer: Self-pay

## 2022-11-10 MED ORDER — LOSARTAN POTASSIUM 50 MG PO TABS
50.0000 mg | ORAL_TABLET | Freq: Every day | ORAL | 3 refills | Status: DC
  Filled 2022-11-10 – 2022-12-23 (×3): qty 90, 90d supply, fill #0

## 2022-11-19 DIAGNOSIS — I10 Essential (primary) hypertension: Secondary | ICD-10-CM | POA: Diagnosis not present

## 2022-11-19 LAB — BASIC METABOLIC PANEL
BUN/Creatinine Ratio: 19 (ref 12–28)
BUN: 14 mg/dL (ref 8–27)
CO2: 27 mmol/L (ref 20–29)
Calcium: 9.3 mg/dL (ref 8.7–10.3)
Chloride: 104 mmol/L (ref 96–106)
Creatinine, Ser: 0.75 mg/dL (ref 0.57–1.00)
Glucose: 87 mg/dL (ref 70–99)
Potassium: 4.6 mmol/L (ref 3.5–5.2)
Sodium: 142 mmol/L (ref 134–144)
eGFR: 91 mL/min/{1.73_m2} (ref >59)

## 2022-12-23 ENCOUNTER — Other Ambulatory Visit: Payer: Self-pay

## 2022-12-23 ENCOUNTER — Other Ambulatory Visit (HOSPITAL_COMMUNITY): Payer: Self-pay

## 2022-12-23 ENCOUNTER — Encounter: Payer: Self-pay | Admitting: Cardiology

## 2022-12-23 DIAGNOSIS — I10 Essential (primary) hypertension: Secondary | ICD-10-CM

## 2022-12-23 DIAGNOSIS — L820 Inflamed seborrheic keratosis: Secondary | ICD-10-CM | POA: Diagnosis not present

## 2022-12-23 DIAGNOSIS — L821 Other seborrheic keratosis: Secondary | ICD-10-CM | POA: Diagnosis not present

## 2022-12-23 MED ORDER — LOSARTAN POTASSIUM 100 MG PO TABS
100.0000 mg | ORAL_TABLET | Freq: Every day | ORAL | 1 refills | Status: DC
  Filled 2022-12-23: qty 90, 90d supply, fill #0
  Filled 2023-03-21: qty 90, 90d supply, fill #1

## 2022-12-24 ENCOUNTER — Other Ambulatory Visit (HOSPITAL_COMMUNITY): Payer: Self-pay

## 2023-02-10 ENCOUNTER — Ambulatory Visit: Payer: Commercial Managed Care - PPO | Admitting: Internal Medicine

## 2023-02-10 ENCOUNTER — Encounter: Payer: Self-pay | Admitting: Internal Medicine

## 2023-02-10 VITALS — BP 128/78 | HR 78 | Temp 97.7°F | Resp 16 | Ht 62.0 in | Wt 132.0 lb

## 2023-02-10 DIAGNOSIS — E785 Hyperlipidemia, unspecified: Secondary | ICD-10-CM

## 2023-02-10 DIAGNOSIS — Z0000 Encounter for general adult medical examination without abnormal findings: Secondary | ICD-10-CM | POA: Diagnosis not present

## 2023-02-10 DIAGNOSIS — I10 Essential (primary) hypertension: Secondary | ICD-10-CM | POA: Diagnosis not present

## 2023-02-10 DIAGNOSIS — E559 Vitamin D deficiency, unspecified: Secondary | ICD-10-CM

## 2023-02-10 DIAGNOSIS — N1831 Chronic kidney disease, stage 3a: Secondary | ICD-10-CM

## 2023-02-10 LAB — CBC WITH DIFFERENTIAL/PLATELET
Basophils Absolute: 0 10*3/uL (ref 0.0–0.1)
Basophils Relative: 0.6 % (ref 0.0–3.0)
Eosinophils Absolute: 0.1 10*3/uL (ref 0.0–0.7)
Eosinophils Relative: 1.3 % (ref 0.0–5.0)
HCT: 39.4 % (ref 36.0–46.0)
Hemoglobin: 13.6 g/dL (ref 12.0–15.0)
Lymphocytes Relative: 32.4 % (ref 12.0–46.0)
Lymphs Abs: 2.3 10*3/uL (ref 0.7–4.0)
MCHC: 34.6 g/dL (ref 30.0–36.0)
MCV: 90.2 fl (ref 78.0–100.0)
Monocytes Absolute: 0.7 10*3/uL (ref 0.1–1.0)
Monocytes Relative: 9.8 % (ref 3.0–12.0)
Neutro Abs: 4 10*3/uL (ref 1.4–7.7)
Neutrophils Relative %: 55.9 % (ref 43.0–77.0)
Platelets: 240 10*3/uL (ref 150.0–400.0)
RBC: 4.37 Mil/uL (ref 3.87–5.11)
RDW: 13.6 % (ref 11.5–15.5)
WBC: 7.2 10*3/uL (ref 4.0–10.5)

## 2023-02-10 LAB — BASIC METABOLIC PANEL
BUN: 15 mg/dL (ref 6–23)
CO2: 29 mEq/L (ref 19–32)
Calcium: 9.5 mg/dL (ref 8.4–10.5)
Chloride: 105 mEq/L (ref 96–112)
Creatinine, Ser: 0.74 mg/dL (ref 0.40–1.20)
GFR: 87.54 mL/min (ref >60.00)
Glucose, Bld: 78 mg/dL (ref 70–99)
Potassium: 4.4 mEq/L (ref 3.5–5.1)
Sodium: 141 mEq/L (ref 135–145)

## 2023-02-10 LAB — HEPATIC FUNCTION PANEL
ALT: 32 U/L (ref 0–35)
AST: 29 U/L (ref 0–37)
Albumin: 4.4 g/dL (ref 3.5–5.2)
Alkaline Phosphatase: 84 U/L (ref 39–117)
Bilirubin, Direct: 0.2 mg/dL (ref 0.0–0.3)
Total Bilirubin: 0.8 mg/dL (ref 0.2–1.2)
Total Protein: 7.2 g/dL (ref 6.0–8.3)

## 2023-02-10 LAB — LIPID PANEL
Cholesterol: 117 mg/dL (ref 0–200)
HDL: 62.8 mg/dL (ref >39.00)
LDL Cholesterol: 45 mg/dL (ref 0–99)
NonHDL: 54.24
Total CHOL/HDL Ratio: 2
Triglycerides: 48 mg/dL (ref 0.0–149.0)
VLDL: 9.6 mg/dL (ref 0.0–40.0)

## 2023-02-10 LAB — TSH: TSH: 1.73 u[IU]/mL (ref 0.35–5.50)

## 2023-02-10 LAB — VITAMIN D 25 HYDROXY (VIT D DEFICIENCY, FRACTURES): VITD: 37.57 ng/mL (ref 30.00–100.00)

## 2023-02-10 NOTE — Progress Notes (Signed)
Subjective:  Patient ID: Melanie Howard, female    DOB: 06-01-1961  Age: 62 y.o. MRN: [id number]  CC: Annual Exam, Hypertension, and Hyperlipidemia   HPI DEBORHA SI presents for a CPX and f/up ----  She is active and denies chest pain, shortness of breath, diaphoresis, or edema.  Outpatient Medications Prior to Visit  Medication Sig Dispense Refill   acetaminophen (TYLENOL) 500 MG tablet Take 1,000 mg by mouth every 6 (six) hours as needed for moderate pain.     losartan (COZAAR) 100 MG tablet Take 1 tablet (100 mg total) by mouth daily. 90 tablet 1   rosuvastatin (CRESTOR) 40 MG tablet Take 1 tablet (40 mg total) by mouth daily. 90 tablet 3   VITAMIN D PO Take 1 capsule by mouth daily.     No facility-administered medications prior to visit.    ROS Review of Systems  Constitutional: Negative.  Negative for chills, diaphoresis, fatigue and fever.  HENT: Negative.    Eyes: Negative.   Respiratory:  Negative for cough, chest tightness, shortness of breath and wheezing.   Cardiovascular:  Negative for chest pain, palpitations and leg swelling.  Gastrointestinal:  Negative for abdominal pain, constipation, diarrhea, nausea and vomiting.  Endocrine: Negative.   Genitourinary: Negative.  Negative for difficulty urinating and hematuria.  Musculoskeletal: Negative.  Negative for arthralgias, joint swelling and myalgias.  Skin: Negative.   Neurological:  Negative for dizziness, weakness and headaches.  Hematological:  Negative for adenopathy. Does not bruise/bleed easily.  Psychiatric/Behavioral: Negative.      Objective:  BP 128/78 (BP Location: Left Arm, Patient Position: Sitting, Cuff Size: Normal)   Pulse 78   Temp 97.7 F (36.5 C) (Oral)   Resp 16   Ht '5\' 2"'$  (1.575 m)   Wt 132 lb (59.9 kg)   LMP 01/08/2014   SpO2 97%   BMI 24.14 kg/m   BP Readings from Last 3 Encounters:  02/10/23 128/78  11/06/22 (!) 148/88  08/12/22 124/76    Wt Readings from Last  3 Encounters:  02/10/23 132 lb (59.9 kg)  11/06/22 130 lb 6.4 oz (59.1 kg)  08/12/22 130 lb (59 kg)    Physical Exam Vitals reviewed.  HENT:     Nose: Nose normal.     Mouth/Throat:     Mouth: Mucous membranes are moist.  Eyes:     General: No scleral icterus.    Conjunctiva/sclera: Conjunctivae normal.  Cardiovascular:     Rate and Rhythm: Normal rate and regular rhythm.     Heart sounds: No murmur heard. Pulmonary:     Effort: Pulmonary effort is normal.     Breath sounds: No stridor. No wheezing, rhonchi or rales.  Abdominal:     General: Abdomen is flat.     Palpations: There is no mass.     Tenderness: There is no abdominal tenderness. There is no guarding.     Hernia: No hernia is present.  Musculoskeletal:        General: Normal range of motion.     Cervical back: Neck supple.     Right lower leg: No edema.     Left lower leg: No edema.  Lymphadenopathy:     Cervical: No cervical adenopathy.  Skin:    General: Skin is warm and dry.  Neurological:     General: No focal deficit present.     Mental Status: She is alert.  Psychiatric:        Mood and Affect: Mood  normal.        Behavior: Behavior normal.     Lab Results  Component Value Date   WBC 7.2 02/10/2023   HGB 13.6 02/10/2023   HCT 39.4 02/10/2023   PLT 240.0 02/10/2023   GLUCOSE 78 02/10/2023   CHOL 117 02/10/2023   TRIG 48.0 02/10/2023   HDL 62.80 02/10/2023   LDLCALC 45 02/10/2023   ALT 32 02/10/2023   AST 29 02/10/2023   NA 141 02/10/2023   K 4.4 02/10/2023   CL 105 02/10/2023   CREATININE 0.74 02/10/2023   BUN 15 02/10/2023   CO2 29 02/10/2023   TSH 1.73 02/10/2023   INR 0.9 01/29/2022    VAS US CAROTID  Result Date: 10/08/2022 Carotid Arterial Duplex Study Patient Name:  Melanie Howard [facility]  Date of Exam:   10/07/2022 Medical Rec #: [id number]           Accession #:    [id number] Date of Birth: 1961/02/26           Patient Gender: F Patient Age:   49 years Exam Location:  [facility]  Procedure:      VAS US CAROTID Referring Phys: Aaron Edelman CRENSHAW --------------------------------------------------------------------------------  Indications:       Mildly elevated velocities noted in the left ICA. Patient                    denies any cerebrovascular symptoms at this time. Risk Factors:      Hypertension, hyperlipidemia, no history of smoking. Comparison Study:  Previous carotid duplex performed 09/18/21 showed RICA                    velocities of [id number] cm/sec and LICA velocities of [id number]                    cm/sec. Performing Technologist: Mariane Masters RVT  Examination Guidelines: A complete evaluation includes B-mode imaging, spectral Doppler, color Doppler, and power Doppler as needed of all accessible portions of each vessel. Bilateral testing is considered an integral part of a complete examination. Limited examinations for reoccurring indications may be performed as noted.  Right Carotid Findings: +----------+--------+--------+--------+------------------+--------+           PSV cm/sEDV cm/sStenosisPlaque DescriptionComments +----------+--------+--------+--------+------------------+--------+ CCA Prox  48      8                                          +----------+--------+--------+--------+------------------+--------+ CCA Distal61      23                                         +----------+--------+--------+--------+------------------+--------+ ICA Prox  55      18      Normal                             +----------+--------+--------+--------+------------------+--------+ ICA Mid   53      22                                         +----------+--------+--------+--------+------------------+--------+ ICA Distal98      43                                         +----------+--------+--------+--------+------------------+--------+  ECA       53      11                                          +----------+--------+--------+--------+------------------+--------+ +----------+--------+-------+----------------+-------------------+           PSV cm/sEDV cmsDescribe        Arm Pressure (mmHG) +----------+--------+-------+----------------+-------------------+ [id number]      1      Multiphasic, WNL                    +----------+--------+-------+----------------+-------------------+ +---------+--------+--+--------+--+---------+ VertebralPSV cm/s69EDV cm/s24Antegrade +---------+--------+--+--------+--+---------+  Left Carotid Findings: +----------+--------+--------+--------+------------------+--------+           PSV cm/sEDV cm/sStenosisPlaque DescriptionComments +----------+--------+--------+--------+------------------+--------+ CCA Prox  62      19                                         +----------+--------+--------+--------+------------------+--------+ CCA Distal77      33                                         +----------+--------+--------+--------+------------------+--------+ ICA Prox  35      14                                         +----------+--------+--------+--------+------------------+--------+ ICA Mid   132     50      40-59%                    tortuous +----------+--------+--------+--------+------------------+--------+ ICA Distal94      37                                         +----------+--------+--------+--------+------------------+--------+ ECA       48      13                                         +----------+--------+--------+--------+------------------+--------+ +----------+--------+--------+----------------+-------------------+           PSV cm/sEDV cm/sDescribe        Arm Pressure (mmHG) +----------+--------+--------+----------------+-------------------+ [id number]      0       Multiphasic, WNL                    +----------+--------+--------+----------------+-------------------+  +---------+--------+--+--------+--+---------+ VertebralPSV cm/s53EDV cm/s18Antegrade +---------+--------+--+--------+--+---------+ Mildly elevated velocities noted in the mid ICA, likely due to vessel tortuosity; no significant plaque formation is appreciated.  Summary: Right Carotid: There is no evidence of stenosis in the right ICA. The                extracranial vessels were near-normal with only minimal wall                thickening or plaque. Left Carotid: Velocities in the left ICA are consistent with a 40-59% stenosis,  likely related to vessel tortuosity noted in the mid ICA. Vertebrals:  Bilateral vertebral arteries demonstrate antegrade flow. Subclavians: Normal flow hemodynamics were seen in bilateral subclavian              arteries. *See table(s) above for measurements and observations. Suggest follow up study in 24 months. Electronically signed by Ida Rogue MD on 10/08/2022 at 6:13:43 PM.    Final     Assessment & Plan:   Tillie was seen today for annual exam, hypertension and hyperlipidemia.  Diagnoses and all orders for this visit:  Primary hypertension- Her blood pressure is well-controlled. -     CBC with Differential/Platelet; Future -     TSH; Future -     Hepatic function panel; Future -     Basic metabolic panel; Future -     Basic metabolic panel -     Hepatic function panel -     TSH -     CBC with Differential/Platelet  Vitamin D deficiency -     VITAMIN D 25 Hydroxy (Vit-D Deficiency, Fractures); Future -     VITAMIN D 25 Hydroxy (Vit-D Deficiency, Fractures)  Hyperlipidemia with target LDL less than 100- LDL goal achieved. Doing well on the statin  -     Lipid panel; Future -     TSH; Future -     Hepatic function panel; Future -     Hepatic function panel -     TSH -     Lipid panel  Routine general medical examination at a health care facility- Exam completed, labs reviewed, vaccines reviewed and updated, cancer screenings are  up-to-date, patient education was given.  Stage 3a chronic kidney disease ([place])- Will avoid nephrotoxic agents.   I am having Melanie Howard maintain her VITAMIN D PO, acetaminophen, rosuvastatin, and losartan.  No orders of the defined types were placed in this encounter.    Follow-up: Return in about 6 months (around 08/13/2023).  Scarlette Calico, MD

## 2023-02-10 NOTE — Patient Instructions (Signed)

## 2023-02-11 ENCOUNTER — Encounter: Payer: Self-pay | Admitting: Internal Medicine

## 2023-02-14 ENCOUNTER — Encounter: Payer: Self-pay | Admitting: Internal Medicine

## 2023-02-15 ENCOUNTER — Other Ambulatory Visit: Payer: Self-pay | Admitting: Internal Medicine

## 2023-02-15 DIAGNOSIS — N1831 Chronic kidney disease, stage 3a: Secondary | ICD-10-CM

## 2023-03-22 ENCOUNTER — Other Ambulatory Visit (HOSPITAL_COMMUNITY): Payer: Self-pay

## 2023-04-14 DIAGNOSIS — I129 Hypertensive chronic kidney disease with stage 1 through stage 4 chronic kidney disease, or unspecified chronic kidney disease: Secondary | ICD-10-CM | POA: Diagnosis not present

## 2023-04-14 DIAGNOSIS — N189 Chronic kidney disease, unspecified: Secondary | ICD-10-CM | POA: Diagnosis not present

## 2023-04-14 DIAGNOSIS — N1831 Chronic kidney disease, stage 3a: Secondary | ICD-10-CM | POA: Diagnosis not present

## 2023-04-14 DIAGNOSIS — E785 Hyperlipidemia, unspecified: Secondary | ICD-10-CM | POA: Diagnosis not present

## 2023-04-15 ENCOUNTER — Other Ambulatory Visit: Payer: Self-pay | Admitting: Nephrology

## 2023-04-15 DIAGNOSIS — N189 Chronic kidney disease, unspecified: Secondary | ICD-10-CM

## 2023-04-15 LAB — LAB REPORT - SCANNED
Albumin, Urine POC: 3
EGFR: 84
Microalb Creat Ratio: 25

## 2023-05-14 ENCOUNTER — Ambulatory Visit
Admission: RE | Admit: 2023-05-14 | Discharge: 2023-05-14 | Disposition: A | Discharge status: Home / Self Care | Payer: Commercial Managed Care - PPO | Source: Ambulatory Visit | Attending: Nephrology | Admitting: Nephrology

## 2023-05-14 DIAGNOSIS — N189 Chronic kidney disease, unspecified: Secondary | ICD-10-CM

## 2023-06-29 IMAGING — MG MM DIGITAL SCREENING BILAT W/ TOMO AND CAD
8 series · 8 of 24 positions shown · non-contrast
Comparison: Previous exam(s).

CLINICAL DATA: Screening.

EXAM:
DIGITAL SCREENING BILATERAL MAMMOGRAM WITH TOMOSYNTHESIS AND CAD
TECHNIQUE: Bilateral screening digital craniocaudal and mediolateral oblique
mammograms were obtained. Bilateral screening digital breast
tomosynthesis was performed. The images were evaluated with
computer-aided detection.

[L MLO synth-2D]
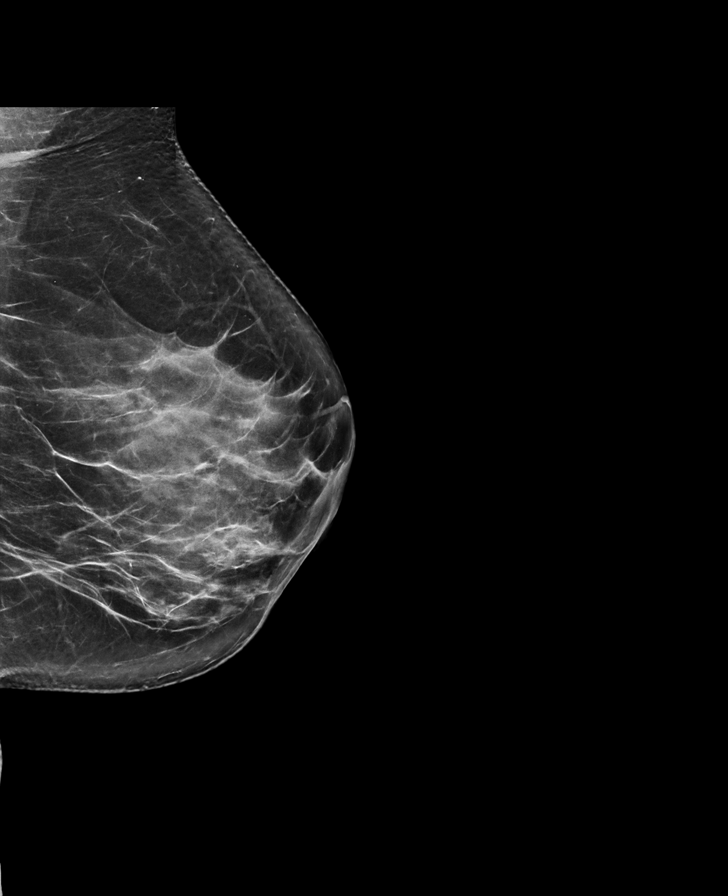

[L CC synth-2D]
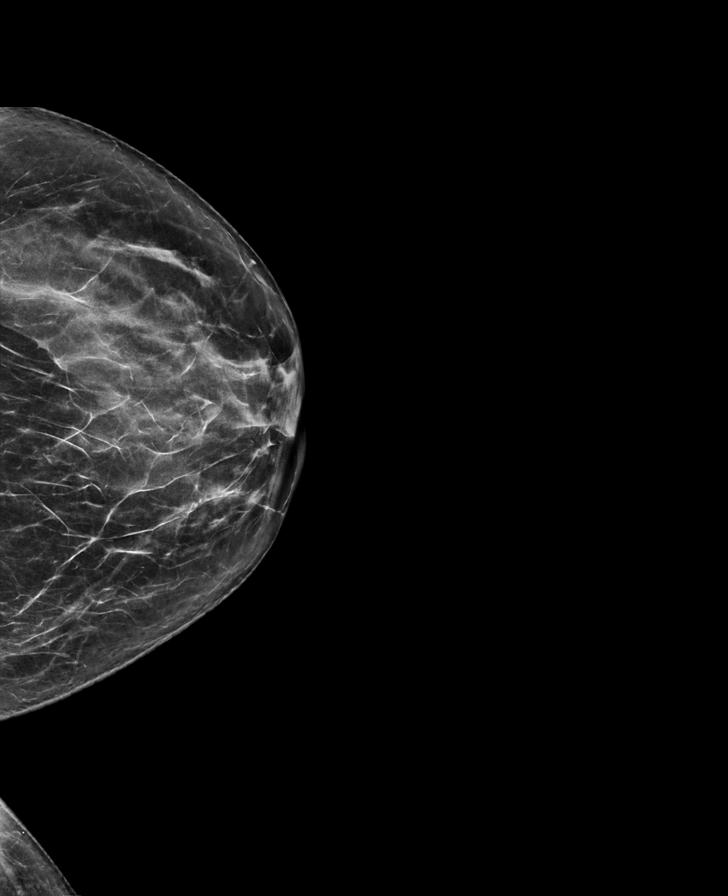

[R CC synth-2D]
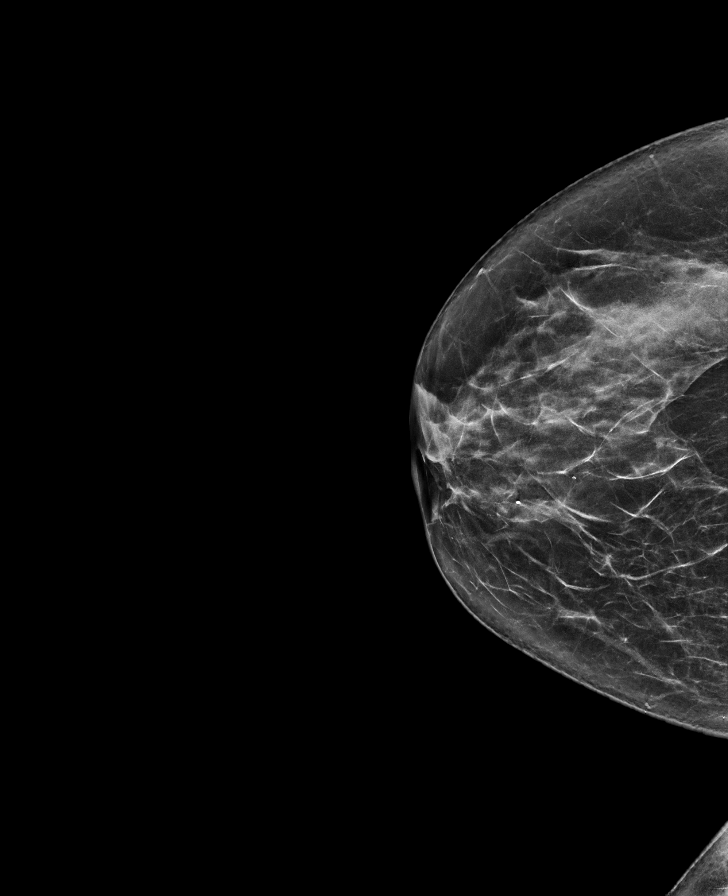

[R MLO synth-2D]
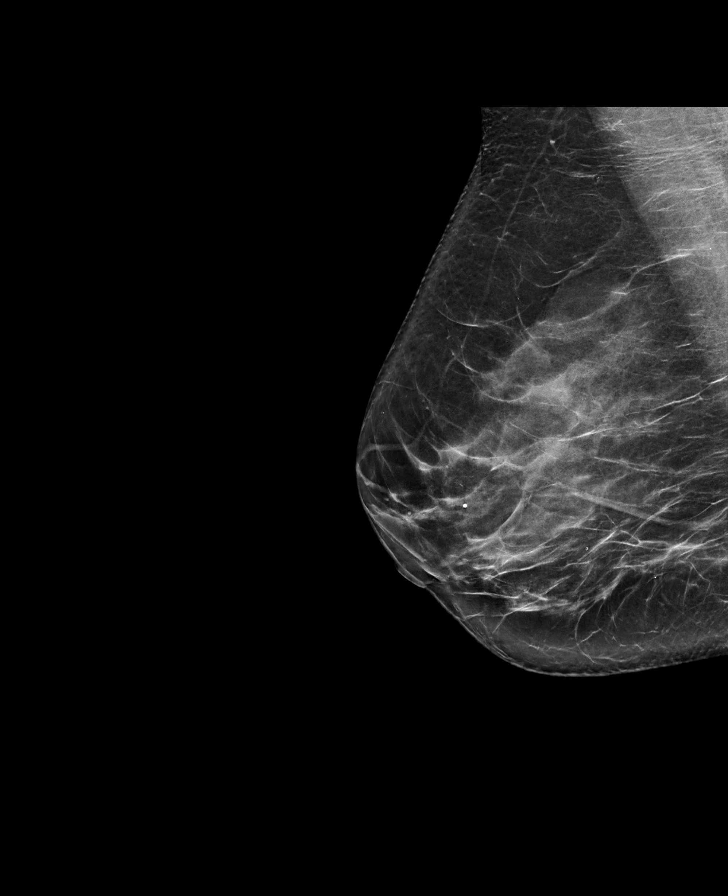

[L MLO tomo · tomo slice 39/76.0]
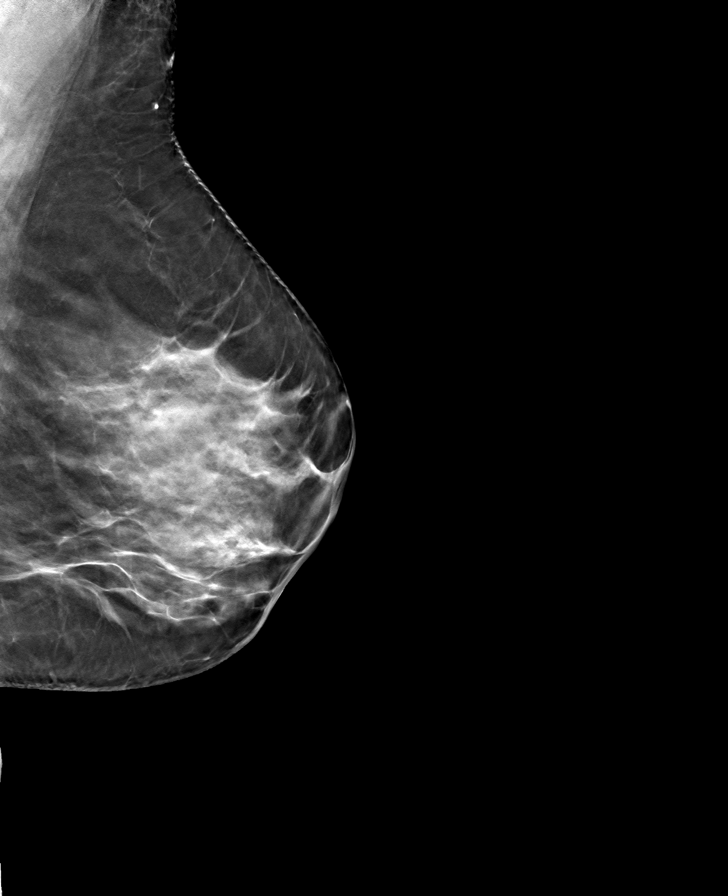

[R CC tomo · tomo slice 35/68.0]
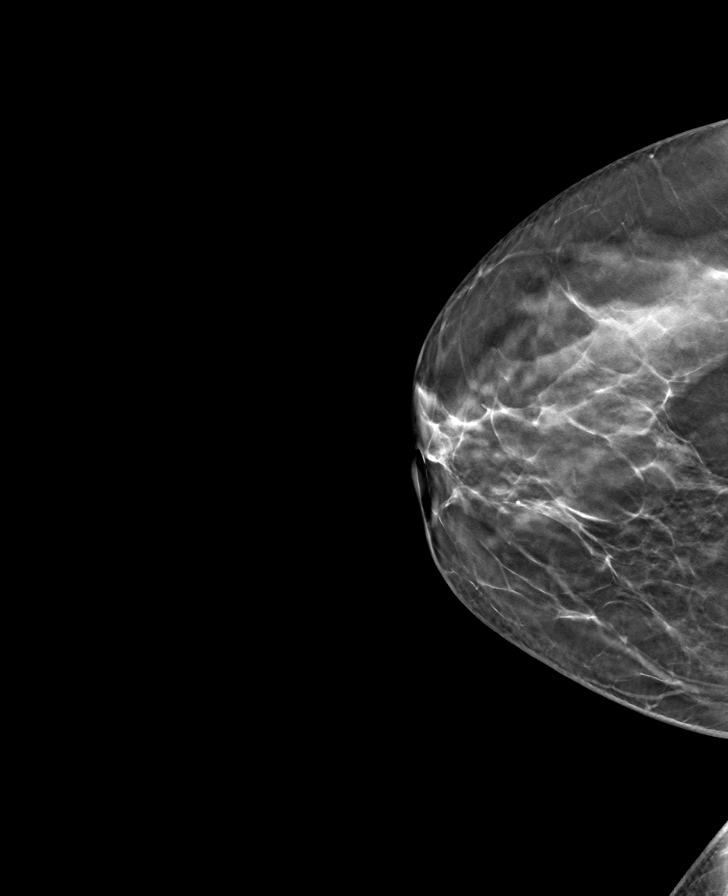

[L CC tomo · tomo slice 34/67.0]
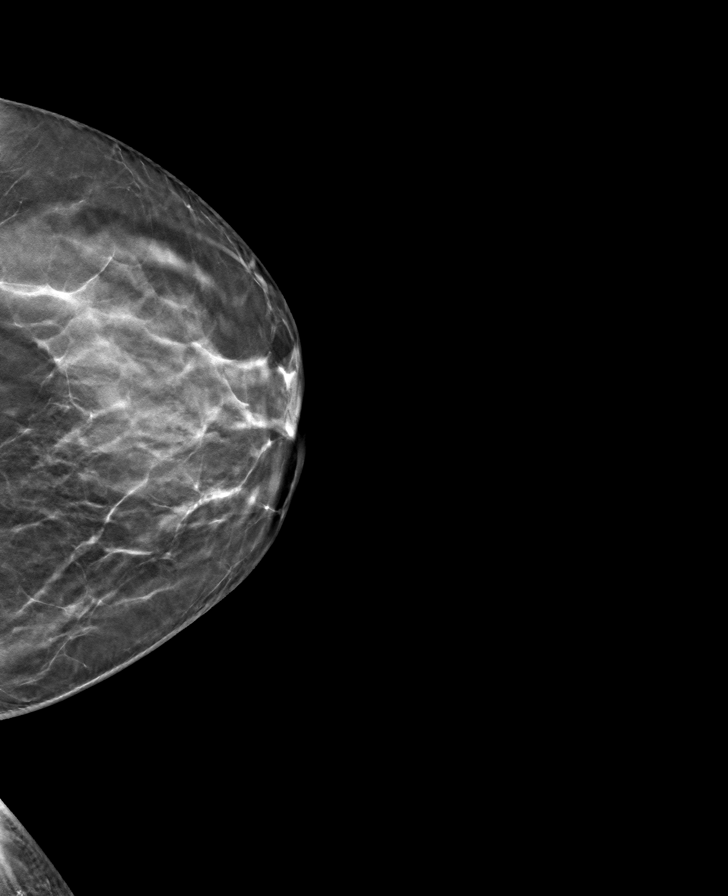

[R MLO tomo · tomo slice 41/81.0]
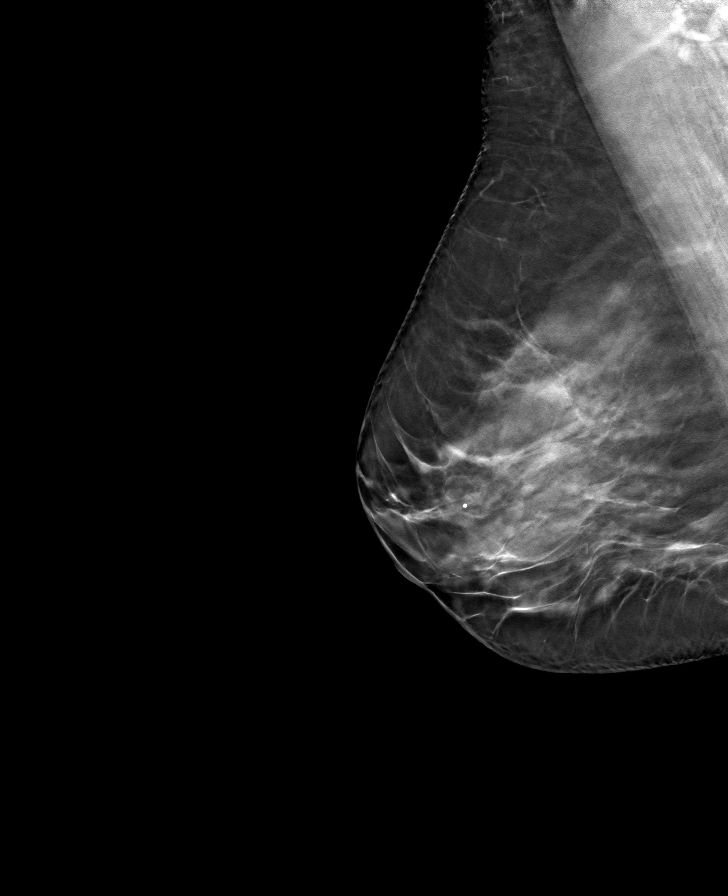

[8 of 24 positions shown; findings below may reference images not displayed]

ACR Breast Density Category c: The breast tissue is heterogeneously
dense, which may obscure small masses.
FINDINGS: There are no findings suspicious for malignancy.
IMPRESSION: No mammographic evidence of malignancy. A result letter of this
screening mammogram will be mailed directly to the patient.

RECOMMENDATION:
Screening mammogram in one year. (Code:Q3-W-BC3)

BI-RADS CATEGORY  1: Negative.

## 2023-07-13 ENCOUNTER — Other Ambulatory Visit: Payer: Self-pay

## 2023-07-13 ENCOUNTER — Other Ambulatory Visit: Payer: Self-pay | Admitting: Cardiology

## 2023-07-13 ENCOUNTER — Other Ambulatory Visit (HOSPITAL_COMMUNITY): Payer: Self-pay

## 2023-07-13 DIAGNOSIS — I10 Essential (primary) hypertension: Secondary | ICD-10-CM

## 2023-07-13 MED ORDER — LOSARTAN POTASSIUM 100 MG PO TABS
100.0000 mg | ORAL_TABLET | Freq: Every day | ORAL | 1 refills | Status: DC
  Filled 2023-07-13: qty 90, 90d supply, fill #0
  Filled 2023-10-12: qty 90, 90d supply, fill #1

## 2023-09-28 ENCOUNTER — Other Ambulatory Visit (HOSPITAL_COMMUNITY): Payer: Self-pay | Admitting: Interventional Radiology

## 2023-09-28 DIAGNOSIS — I771 Stricture of artery: Secondary | ICD-10-CM

## 2023-10-06 ENCOUNTER — Ambulatory Visit (HOSPITAL_COMMUNITY)
Admission: RE | Admit: 2023-10-06 | Discharge: 2023-10-06 | Disposition: A | Discharge status: Home / Self Care | Payer: Commercial Managed Care - PPO | Source: Ambulatory Visit | Attending: Interventional Radiology | Admitting: Interventional Radiology

## 2023-10-06 DIAGNOSIS — R519 Headache, unspecified: Secondary | ICD-10-CM | POA: Diagnosis not present

## 2023-10-06 DIAGNOSIS — I771 Stricture of artery: Secondary | ICD-10-CM | POA: Diagnosis not present

## 2023-10-06 DIAGNOSIS — I672 Cerebral atherosclerosis: Secondary | ICD-10-CM | POA: Diagnosis not present

## 2023-10-06 LAB — POCT I-STAT CREATININE: Creatinine, Ser: 0.7 mg/dL (ref 0.44–1.00)

## 2023-10-06 MED ORDER — IOHEXOL 350 MG/ML SOLN
75.0000 mL | Freq: Once | INTRAVENOUS | Status: AC | PRN
  Administered 2023-10-06: 75 mL via INTRAVENOUS

## 2023-10-12 ENCOUNTER — Other Ambulatory Visit: Payer: Self-pay

## 2023-10-12 ENCOUNTER — Other Ambulatory Visit: Payer: Self-pay | Admitting: Cardiology

## 2023-10-12 ENCOUNTER — Other Ambulatory Visit (HOSPITAL_COMMUNITY): Payer: Self-pay

## 2023-10-12 MED ORDER — ROSUVASTATIN CALCIUM 40 MG PO TABS
40.0000 mg | ORAL_TABLET | Freq: Every day | ORAL | 3 refills | Status: DC
  Filled 2023-10-12: qty 90, 90d supply, fill #0
  Filled 2024-03-15: qty 90, 90d supply, fill #1
  Filled 2024-06-10: qty 90, 90d supply, fill #2

## 2023-10-20 ENCOUNTER — Ambulatory Visit: Payer: Commercial Managed Care - PPO | Admitting: Internal Medicine

## 2023-10-20 ENCOUNTER — Other Ambulatory Visit (HOSPITAL_COMMUNITY): Payer: Self-pay

## 2023-10-20 VITALS — BP 144/88 | HR 65 | Temp 98.1°F | Resp 16 | Ht 62.0 in | Wt 135.4 lb

## 2023-10-20 DIAGNOSIS — E785 Hyperlipidemia, unspecified: Secondary | ICD-10-CM

## 2023-10-20 DIAGNOSIS — G43E19 Chronic migraine with aura, intractable, without status migrainosus: Secondary | ICD-10-CM | POA: Diagnosis not present

## 2023-10-20 DIAGNOSIS — H43392 Other vitreous opacities, left eye: Secondary | ICD-10-CM | POA: Diagnosis not present

## 2023-10-20 DIAGNOSIS — E2839 Other primary ovarian failure: Secondary | ICD-10-CM | POA: Insufficient documentation

## 2023-10-20 DIAGNOSIS — I10 Essential (primary) hypertension: Secondary | ICD-10-CM | POA: Diagnosis not present

## 2023-10-20 DIAGNOSIS — Z1231 Encounter for screening mammogram for malignant neoplasm of breast: Secondary | ICD-10-CM | POA: Insufficient documentation

## 2023-10-20 LAB — CBC WITH DIFFERENTIAL/PLATELET
Basophils Absolute: 0 10*3/uL (ref 0.0–0.1)
Basophils Relative: 0.6 % (ref 0.0–3.0)
Eosinophils Absolute: 0.1 10*3/uL (ref 0.0–0.7)
Eosinophils Relative: 1.6 % (ref 0.0–5.0)
HCT: 38.9 % (ref 36.0–46.0)
Hemoglobin: 13.2 g/dL (ref 12.0–15.0)
Lymphocytes Relative: 35.4 % (ref 12.0–46.0)
Lymphs Abs: 2.7 10*3/uL (ref 0.7–4.0)
MCHC: 33.9 g/dL (ref 30.0–36.0)
MCV: 92 fL (ref 78.0–100.0)
Monocytes Absolute: 0.6 10*3/uL (ref 0.1–1.0)
Monocytes Relative: 8.5 % (ref 3.0–12.0)
Neutro Abs: 4.1 10*3/uL (ref 1.4–7.7)
Neutrophils Relative %: 53.9 % (ref 43.0–77.0)
Platelets: 241 10*3/uL (ref 150.0–400.0)
RBC: 4.23 Mil/uL (ref 3.87–5.11)
RDW: 12.8 % (ref 11.5–15.5)
WBC: 7.5 10*3/uL (ref 4.0–10.5)

## 2023-10-20 LAB — BASIC METABOLIC PANEL
BUN: 13 mg/dL (ref 6–23)
CO2: 29 meq/L (ref 19–32)
Calcium: 9.3 mg/dL (ref 8.4–10.5)
Chloride: 105 meq/L (ref 96–112)
Creatinine, Ser: 0.74 mg/dL (ref 0.40–1.20)
GFR: 87.12 mL/min (ref >60.00)
Glucose, Bld: 94 mg/dL (ref 70–99)
Potassium: 4.1 meq/L (ref 3.5–5.1)
Sodium: 141 meq/L (ref 135–145)

## 2023-10-20 MED ORDER — QULIPTA 60 MG PO TABS
1.0000 | ORAL_TABLET | Freq: Every day | ORAL | 1 refills | Status: DC
  Filled 2023-10-20: qty 30, 30d supply, fill #0
  Filled 2024-01-09 – 2024-01-12 (×3): qty 90, 90d supply, fill #0

## 2023-10-20 NOTE — Patient Instructions (Signed)

## 2023-10-20 NOTE — Progress Notes (Signed)
Subjective:  Patient ID: Melanie Howard, female    DOB: 1961-06-05  Age: 62 y.o. MRN: [id number]  CC: Hypertension and Headache   HPI Melanie Howard presents for f/up ----  Discussed the use of AI scribe software for clinical note transcription with the patient, who gave verbal consent to proceed.  History of Present Illness   The patient, with a history of hypertension, presents with a chronic headache, occurring approximately five out of seven days a week. The headache is localized to the left forehead and is managed with Tylenol and caffeine, which sometimes alleviates the pain. The patient also reports that physical activity, such as running, can sometimes relieve the headache. The patient denies any associated dizziness, lightheadedness, or visual disturbances related to the headache.  In addition to the headache, the patient reports a recent visual disturbance in the left eye, characterized by peripheral flashes of light and floaters, which occurred last week. The patient also notes a sensation of a film over the left eye. However, these symptoms have not recurred this week.  The patient denies any chest pain, shortness of breath, or symptoms suggestive of high blood pressure such as headache or blurred vision. The patient is active and has no concerns about her heart. The patient's current medications do not cause any side effects.       Outpatient Medications Prior to Visit  Medication Sig Dispense Refill   acetaminophen (TYLENOL) 500 MG tablet Take 1,000 mg by mouth every 6 (six) hours as needed for moderate pain.     losartan (COZAAR) 100 MG tablet Take 1 tablet (100 mg total) by mouth daily. 90 tablet 1   rosuvastatin (CRESTOR) 40 MG tablet Take 1 tablet (40 mg total) by mouth daily. 90 tablet 3   VITAMIN D PO Take 1 capsule by mouth daily.     No facility-administered medications prior to visit.    ROS Review of Systems  Constitutional:  Negative for appetite  change, chills, diaphoresis and fatigue.  HENT: Negative.    Eyes:  Positive for visual disturbance.       Left floaters  Respiratory:  Negative for cough, chest tightness, shortness of breath and wheezing.   Cardiovascular:  Negative for chest pain, palpitations and leg swelling.  Gastrointestinal: Negative.  Negative for abdominal pain, constipation, diarrhea, nausea and vomiting.  Genitourinary: Negative.  Negative for difficulty urinating.  Musculoskeletal: Negative.  Negative for arthralgias and myalgias.  Skin: Negative.   Neurological:  Positive for headaches. Negative for dizziness, tremors, seizures, syncope, facial asymmetry, speech difficulty, weakness, light-headedness and numbness.  Hematological: Negative.   Psychiatric/Behavioral: Negative.  Negative for agitation.     Objective:  BP (!) 144/88 (BP Location: Left Arm, Patient Position: Sitting, Cuff Size: Normal)   Pulse 65   Temp 98.1 F (36.7 C) (Oral)   Resp 16   Ht 5\' 2"  (1.575 m)   Wt 135 lb 6.4 oz (61.4 kg)   LMP 01/08/2014   SpO2 96%   BMI 24.76 kg/m   BP Readings from Last 3 Encounters:  10/20/23 (!) 144/88  02/10/23 128/78  11/06/22 (!) 148/88    Wt Readings from Last 3 Encounters:  10/20/23 135 lb 6.4 oz (61.4 kg)  02/10/23 132 lb (59.9 kg)  11/06/22 130 lb 6.4 oz (59.1 kg)    Physical Exam Vitals reviewed.  Constitutional:      Appearance: She is well-developed.  HENT:     Nose: Nose normal.     Mouth/Throat:  Mouth: Mucous membranes are moist.  Eyes:     General: No scleral icterus.    Conjunctiva/sclera: Conjunctivae normal.  Cardiovascular:     Rate and Rhythm: Normal rate and regular rhythm.     Heart sounds: No murmur heard. Pulmonary:     Effort: Pulmonary effort is normal.     Breath sounds: No stridor. No wheezing, rhonchi or rales.  Abdominal:     General: Abdomen is flat.     Palpations: There is no mass.     Tenderness: There is no abdominal tenderness. There is no  guarding.     Hernia: No hernia is present.  Musculoskeletal:        General: Normal range of motion.     Cervical back: Neck supple.     Right lower leg: No edema.     Left lower leg: No edema.  Lymphadenopathy:     Cervical: No cervical adenopathy.  Skin:    General: Skin is warm and dry.  Neurological:     General: No focal deficit present.     Mental Status: She is alert. Mental status is at baseline.  Psychiatric:        Mood and Affect: Mood normal.        Behavior: Behavior normal.     Lab Results  Component Value Date   WBC 7.5 10/20/2023   HGB 13.2 10/20/2023   HCT 38.9 10/20/2023   PLT 241.0 10/20/2023   GLUCOSE 94 10/20/2023   CHOL 117 02/10/2023   TRIG 48.0 02/10/2023   HDL 62.80 02/10/2023   LDLCALC 45 02/10/2023   ALT 32 02/10/2023   AST 29 02/10/2023   NA 141 10/20/2023   K 4.1 10/20/2023   CL 105 10/20/2023   CREATININE 0.74 10/20/2023   BUN 13 10/20/2023   CO2 29 10/20/2023   TSH 1.73 02/10/2023   INR 0.9 01/29/2022    No results found.  Assessment & Plan:   Intractable chronic migraine with aura and without status migrainosus -     Qulipta; Take 1 tablet (60 mg total) by mouth daily.  Dispense: 90 tablet; Refill: 1  Hyperlipidemia with target LDL less than 100  Primary hypertension- She has not achieved her blood pressure goal of 130/80.  She agrees to continue working on her lifestyle modifications. -     Basic metabolic panel; Future -     CBC with Differential/Platelet; Future  Screening mammogram for breast cancer -     3D Screening Mammogram, Left and Right; Future  Estrogen deficiency -     DG Bone Density; Future  Vitreous floaters of left eye -     Ambulatory referral to Ophthalmology     Follow-up: Return in about 6 months (around 04/18/2024).  Sanda Linger, MD

## 2023-10-21 ENCOUNTER — Other Ambulatory Visit (HOSPITAL_COMMUNITY): Payer: Self-pay

## 2023-10-22 ENCOUNTER — Telehealth: Payer: Self-pay

## 2023-10-22 ENCOUNTER — Other Ambulatory Visit (HOSPITAL_COMMUNITY): Payer: Self-pay

## 2023-10-22 NOTE — Telephone Encounter (Signed)
Pharmacy Patient Advocate Encounter   Received notification from CoverMyMeds that prior authorization for Qulipta 60MG  tablets is required/requested.   Insurance verification completed.   The patient is insured through [facility] .   Per test claim: PA required; PA submitted to above mentioned insurance via CoverMyMeds Key/confirmation #/EOC [id number] Status is pending

## 2023-10-26 ENCOUNTER — Encounter (HOSPITAL_COMMUNITY): Payer: Self-pay

## 2023-10-26 ENCOUNTER — Other Ambulatory Visit (HOSPITAL_COMMUNITY): Payer: Self-pay

## 2023-10-27 NOTE — Telephone Encounter (Signed)
Pharmacy Patient Advocate Encounter  Received notification from [facility] that Prior Authorization for Qulipta 60MG  tablets has been DENIED.  Full denial letter will be uploaded to the media tab. See denial reason below.  To get the request approved, your doctor needs to show that you have met the guideline rules below.   Requires a trial of or contraindication to ONE of the following preventative migraine treatments: divalproex sodium/sodium valproate, topiramate, propranolol, timolol, metoprolol, atenolol, nadolol, amitriptyline, venlafaxine, botox  PA #/Case ID/Reference #: [id number]

## 2023-10-29 ENCOUNTER — Other Ambulatory Visit (HOSPITAL_COMMUNITY): Payer: Self-pay

## 2023-10-29 ENCOUNTER — Other Ambulatory Visit: Payer: Self-pay | Admitting: Internal Medicine

## 2023-10-29 DIAGNOSIS — G43E19 Chronic migraine with aura, intractable, without status migrainosus: Secondary | ICD-10-CM

## 2023-10-29 MED ORDER — AMITRIPTYLINE HCL 10 MG PO TABS
10.0000 mg | ORAL_TABLET | Freq: Every day | ORAL | 0 refills | Status: DC
  Filled 2023-10-29: qty 90, 90d supply, fill #0

## 2023-11-08 ENCOUNTER — Telehealth (HOSPITAL_COMMUNITY): Payer: Self-pay

## 2023-11-08 NOTE — Telephone Encounter (Signed)
Pt agreed to f/u in 1 year with a cta head and neck. AB

## 2023-11-08 NOTE — Telephone Encounter (Signed)
Called regarding recent imaging, no answer, left vm. AB

## 2023-11-10 ENCOUNTER — Other Ambulatory Visit (HOSPITAL_COMMUNITY): Payer: Self-pay

## 2024-01-07 ENCOUNTER — Ambulatory Visit
Admission: RE | Admit: 2024-01-07 | Discharge: 2024-01-07 | Disposition: A | Discharge status: Home / Self Care | Payer: Commercial Managed Care - PPO | Source: Ambulatory Visit | Attending: Internal Medicine | Admitting: Internal Medicine

## 2024-01-07 DIAGNOSIS — Z1231 Encounter for screening mammogram for malignant neoplasm of breast: Secondary | ICD-10-CM

## 2024-01-07 DIAGNOSIS — N958 Other specified menopausal and perimenopausal disorders: Secondary | ICD-10-CM | POA: Diagnosis not present

## 2024-01-07 DIAGNOSIS — E2839 Other primary ovarian failure: Secondary | ICD-10-CM | POA: Diagnosis not present

## 2024-01-09 ENCOUNTER — Other Ambulatory Visit: Payer: Self-pay | Admitting: Cardiology

## 2024-01-09 DIAGNOSIS — I10 Essential (primary) hypertension: Secondary | ICD-10-CM

## 2024-01-10 ENCOUNTER — Other Ambulatory Visit (HOSPITAL_COMMUNITY): Payer: Self-pay

## 2024-01-12 ENCOUNTER — Other Ambulatory Visit (HOSPITAL_COMMUNITY): Payer: Self-pay

## 2024-01-12 ENCOUNTER — Telehealth: Payer: Self-pay

## 2024-01-12 MED ORDER — LOSARTAN POTASSIUM 100 MG PO TABS
100.0000 mg | ORAL_TABLET | Freq: Every day | ORAL | 0 refills | Status: DC
  Filled 2024-01-12: qty 15, 15d supply, fill #0

## 2024-01-12 NOTE — Telephone Encounter (Signed)
 Pharmacy Patient Advocate Encounter   Received notification from  [facility] that prior authorization for Qulipta  60MG  tablets is required/requested.   Insurance verification completed.   The patient is insured through [facility] .   Per test claim: PA required; PA submitted to above mentioned insurance via CoverMyMeds Key/confirmation #/EOC [id number] Status is pending

## 2024-01-13 ENCOUNTER — Encounter: Payer: Self-pay | Admitting: Internal Medicine

## 2024-01-13 NOTE — Telephone Encounter (Signed)
 Pharmacy Patient Advocate Encounter  Received notification from [facility] that Prior Authorization for Melanie Howard has been APPROVED from 01/12/2024 to 07/11/2024   PA #/Case ID/Reference #: [id number]

## 2024-01-26 NOTE — Progress Notes (Unsigned)
HPI: FU carotid artery disease and hypertension. Calcium score May 2022 0. 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  CTA of the head and neck October 2024 showed torturous carotid arteries but no stenosis or aneurysm.  Since last seen the patient denies any dyspnea on exertion, orthopnea, PND, pedal edema, palpitations, syncope or chest pain.   Current Outpatient Medications  Medication Sig Dispense Refill   acetaminophen (TYLENOL) 500 MG tablet Take 1,000 mg by mouth every 6 (six) hours as needed for moderate pain.     losartan (COZAAR) 100 MG tablet Take 1 tablet (100 mg total) by mouth daily. *Need office visit for refills* 15 tablet 0   QULIPTA 60 MG TABS Take 1 tablet (60 mg total) by mouth daily. 90 tablet 1   rosuvastatin (CRESTOR) 40 MG tablet Take 1 tablet (40 mg total) by mouth daily. 90 tablet 3   VITAMIN D PO Take 1 capsule by mouth daily.     amitriptyline (ELAVIL) 10 MG tablet Take 1 tablet (10 mg total) by mouth at bedtime. 90 tablet 0   No current facility-administered medications for this visit.     Past Medical History:  Diagnosis Date   Carotid artery disease (HCC)    COVID    History of kidney stones    Hyperlipidemia    Hypertension     Past Surgical History:  Procedure Laterality Date   COLONOSCOPY WITH PROPOFOL N/A 04/09/2021   Procedure: COLONOSCOPY WITH PROPOFOL;  Surgeon: Dolores Frame, MD;  [facility]: [facility];  Service: Gastroenterology;  Laterality: N/A;  8:45   COLPOSCOPY     GYNECOLOGIC CRYOSURGERY  college   paps normal since   IR ANGIO INTRA EXTRACRAN SEL COM CAROTID INNOMINATE BILAT MOD SED  01/29/2022   IR ANGIO VERTEBRAL  SEL VERTEBRAL BILAT MOD SED  01/29/2022   IR RADIOLOGIST EVAL & MGMT  01/15/2022   IR US GUIDE VASC ACCESS RIGHT  01/29/2022   POLYPECTOMY  04/09/2021   Procedure: POLYPECTOMY INTESTINAL;  Surgeon: Dolores Frame, MD;  Location: [facility];  Service: Gastroenterology;;   TUBAL LIGATION      Social History   Socioeconomic History   Marital status: Married    Spouse name: Not on file   Number of children: 3   Years of education: Not on file   Highest education level: Bachelor's degree (e.g., BA, AB, BS)  Occupational History   Not on file  Tobacco Use   Smoking status: Never   Smokeless tobacco: Never  Vaping Use   Vaping status: Never Used  Substance and Sexual Activity   Alcohol use: Yes    Comment: Rare; socially on weekends   Drug use: No   Sexual activity: Yes    Birth control/protection: Surgical    Comment: BTL-1st intercourse 63 yo-Fewer than 5 partners  Other Topics Concern   Not on file  Social History Narrative   Married for 32 years.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   Social Drivers of Health   Financial Resource Strain: Low Risk  (10/20/2023)   Overall Financial Resource Strain (CARDIA)    Difficulty of Paying Living Expenses: Not hard at all  Food Insecurity: No Food Insecurity (  10/20/2023)   Hunger Vital Sign    Worried About Running Out of Food in the Last Year: Never true    Ran Out of Food in the Last Year: Never true  Transportation Needs: No Transportation Needs (10/20/2023)   PRAPARE - Administrator, Civil Service (Medical): No    Lack of Transportation (Non-Medical): No  Physical Activity: Sufficiently Active (10/20/2023)   Exercise Vital Sign    Days of Exercise per Week: 5 days    Minutes of Exercise per Session: 50 min  Stress: No Stress Concern Present (10/20/2023)   [employer] of Occupational Health - Occupational Stress Questionnaire    Feeling of Stress : Only a little  Social  Connections: Socially Integrated (10/20/2023)   Social Connection and Isolation Panel [NHANES]    Frequency of Communication with Friends and Family: Three times a week    Frequency of Social Gatherings with Friends and Family: Once a week    Attends Religious Services: More than 4 times per year    Active Member of [employer] or Organizations: Yes    Attends Engineer, structural: More than 4 times per year    Marital Status: Married  Catering manager Violence: Not on file    Family History  Problem Relation Age of Onset   Hypertension Mother    Hypertension Father    Heart disease Father    Depression Sister    Restless legs syndrome Sister    Breast cancer Maternal Aunt 50   Breast cancer Maternal Aunt 50   Hypertension Brother     ROS: no fevers or chills, productive cough, hemoptysis, dysphasia, odynophagia, melena, hematochezia, dysuria, hematuria, rash, seizure activity, orthopnea, PND, pedal edema, claudication. Remaining systems are negative.  Physical Exam: Well-developed well-nourished in no acute distress.  Skin is warm and dry.  HEENT is normal.  Neck is supple.  Chest is clear to auscultation with normal expansion.  Cardiovascular exam is regular rate and rhythm.  Abdominal exam nontender or distended. No masses palpated. Extremities show no edema. neuro grossly intact  EKG Interpretation Date/Time:  Friday January 28 2024 09:12:26 EST Ventricular Rate:  57 PR Interval:  148 QRS Duration:  74 QT Interval:  404 QTC Calculation: 393 R Axis:   34  Text Interpretation: Sinus bradycardia Confirmed by Olga Millers ([id number]) on 01/28/2024 9:15:07 AM    A/P  1 carotid artery disease-most recent CTA showed no significant obstruction.  2 hypertension-blood pressure controlled.  Continue present medications.  Check potassium and renal function.  3  family history of coronary artery disease-previous calcium score was 0.  Continue risk factor  modification.  4 hyperlipidemia-continue Crestor.  Check lipids and liver.  Olga Millers, MD

## 2024-01-28 ENCOUNTER — Encounter: Payer: Self-pay | Admitting: Cardiology

## 2024-01-28 ENCOUNTER — Ambulatory Visit: Payer: Commercial Managed Care - PPO | Attending: Cardiology | Admitting: Cardiology

## 2024-01-28 VITALS — BP 120/80 | HR 57 | Ht 62.0 in | Wt 138.2 lb

## 2024-01-28 DIAGNOSIS — E7800 Pure hypercholesterolemia, unspecified: Secondary | ICD-10-CM | POA: Diagnosis not present

## 2024-01-28 DIAGNOSIS — I6522 Occlusion and stenosis of left carotid artery: Secondary | ICD-10-CM | POA: Diagnosis not present

## 2024-01-28 DIAGNOSIS — I10 Essential (primary) hypertension: Secondary | ICD-10-CM

## 2024-01-28 LAB — LIPID PANEL
Chol/HDL Ratio: 2 {ratio} (ref 0.0–4.4)
Cholesterol, Total: 123 mg/dL (ref 100–199)
HDL: 63 mg/dL (ref >39)
LDL Chol Calc (NIH): 47 mg/dL (ref 0–99)
Triglycerides: 62 mg/dL (ref 0–149)
VLDL Cholesterol Cal: 13 mg/dL (ref 5–40)

## 2024-01-28 LAB — COMPREHENSIVE METABOLIC PANEL
ALT: 30 [IU]/L (ref 0–32)
AST: 24 [IU]/L (ref 0–40)
Albumin: 4.8 g/dL (ref 3.9–4.9)
Alkaline Phosphatase: 98 [IU]/L (ref 44–121)
BUN/Creatinine Ratio: 14 (ref 12–28)
BUN: 11 mg/dL (ref 8–27)
Bilirubin Total: 0.5 mg/dL (ref 0.0–1.2)
CO2: 25 mmol/L (ref 20–29)
Calcium: 9.6 mg/dL (ref 8.7–10.3)
Chloride: 105 mmol/L (ref 96–106)
Creatinine, Ser: 0.76 mg/dL (ref 0.57–1.00)
Globulin, Total: 2.4 g/dL (ref 1.5–4.5)
Glucose: 87 mg/dL (ref 70–99)
Potassium: 4.5 mmol/L (ref 3.5–5.2)
Sodium: 143 mmol/L (ref 134–144)
Total Protein: 7.2 g/dL (ref 6.0–8.5)
eGFR: 89 mL/min/{1.73_m2} (ref >59)

## 2024-01-28 NOTE — Patient Instructions (Signed)
Medication Instructions:  Your physician recommends that you continue on your current medications as directed. Please refer to the Current Medication list given to you today.  *If you need a refill on your cardiac medications before your next appointment, please call your pharmacy*   Lab Work: Your physician recommends that you have labs drawn today: CMET & lipids  If you have labs (blood work) drawn today and your tests are completely normal, you will receive your results only by: MyChart Message (if you have MyChart) OR A paper copy in the mail If you have any lab test that is abnormal or we need to change your treatment, we will call you to review the results.    Follow-Up: At [facility], you and your health needs are our priority.  As part of our continuing mission to provide you with exceptional heart care, we have created designated Provider Care Teams.  These Care Teams include your primary Cardiologist (physician) and Advanced Practice Providers (APPs -  Physician Assistants and Nurse Practitioners) who all work together to provide you with the care you need, when you need it.  We recommend signing up for the patient portal called "MyChart".  Sign up information is provided on this After Visit Summary.  MyChart is used to connect with patients for Virtual Visits (Telemedicine).  Patients are able to view lab/test results, encounter notes, upcoming appointments, etc.  Non-urgent messages can be sent to your provider as well.   To learn more about what you can do with MyChart, go to [email or web address].    Your next appointment:   12 month(s)  Provider:   Olga Millers, MD     Other Instructions    1st Floor: - Lobby - Registration  - Pharmacy  - Lab - Cafe  2nd Floor: - PV Lab - Diagnostic Testing (echo, CT, nuclear med)  3rd Floor: - Vacant  4th Floor: - TCTS (cardiothoracic surgery) - AFib Clinic - Structural Heart Clinic - Vascular Surgery   - Vascular Ultrasound  5th Floor: - [facility] (general and EP) - Clinical Pharmacy for coumadin, hypertension, lipid, weight-loss medications, and med management appointments    Valet parking services will be available as well.

## 2024-01-31 ENCOUNTER — Other Ambulatory Visit: Payer: Self-pay | Admitting: Cardiology

## 2024-01-31 ENCOUNTER — Other Ambulatory Visit (HOSPITAL_COMMUNITY): Payer: Self-pay

## 2024-01-31 DIAGNOSIS — I10 Essential (primary) hypertension: Secondary | ICD-10-CM

## 2024-01-31 MED ORDER — LOSARTAN POTASSIUM 100 MG PO TABS
100.0000 mg | ORAL_TABLET | Freq: Every day | ORAL | 3 refills | Status: DC
  Filled 2024-01-31: qty 90, 90d supply, fill #0

## 2024-04-19 ENCOUNTER — Encounter: Payer: Self-pay | Admitting: Internal Medicine

## 2024-04-19 ENCOUNTER — Other Ambulatory Visit: Payer: Self-pay

## 2024-04-19 ENCOUNTER — Ambulatory Visit: Payer: Commercial Managed Care - PPO | Admitting: Internal Medicine

## 2024-04-19 ENCOUNTER — Other Ambulatory Visit (HOSPITAL_COMMUNITY): Payer: Self-pay

## 2024-04-19 VITALS — BP 132/84 | HR 60 | Temp 98.2°F | Resp 16 | Ht 62.0 in | Wt 135.2 lb

## 2024-04-19 DIAGNOSIS — Z0001 Encounter for general adult medical examination with abnormal findings: Secondary | ICD-10-CM | POA: Insufficient documentation

## 2024-04-19 DIAGNOSIS — Z124 Encounter for screening for malignant neoplasm of cervix: Secondary | ICD-10-CM | POA: Insufficient documentation

## 2024-04-19 DIAGNOSIS — G43E19 Chronic migraine with aura, intractable, without status migrainosus: Secondary | ICD-10-CM | POA: Diagnosis not present

## 2024-04-19 DIAGNOSIS — Z0000 Encounter for general adult medical examination without abnormal findings: Secondary | ICD-10-CM | POA: Diagnosis not present

## 2024-04-19 DIAGNOSIS — I10 Essential (primary) hypertension: Secondary | ICD-10-CM | POA: Diagnosis not present

## 2024-04-19 MED ORDER — LOSARTAN POTASSIUM 100 MG PO TABS
100.0000 mg | ORAL_TABLET | Freq: Every day | ORAL | 1 refills | Status: DC
  Filled 2024-04-19: qty 90, 90d supply, fill #0
  Filled 2024-08-16: qty 90, 90d supply, fill #1

## 2024-04-19 MED ORDER — QULIPTA 60 MG PO TABS
1.0000 | ORAL_TABLET | Freq: Every day | ORAL | 1 refills | Status: AC
  Filled 2024-04-19: qty 90, 90d supply, fill #0

## 2024-04-19 NOTE — Patient Instructions (Signed)

## 2024-04-19 NOTE — Progress Notes (Unsigned)
 Subjective:  Patient ID: Melanie Howard, female    DOB: September 06, 1961  Age: 63 y.o. MRN: [id number]  CC: Annual Exam, Hypertension, and Hyperlipidemia   HPI ADESEWA PARLIER presents for a CPX and f/up ---  Discussed the use of AI scribe software for clinical note transcription with the patient, who gave verbal consent to proceed.  History of Present Illness   FARRAN SANKER is a 63 year old female who presents for follow-up regarding her headache management with Qulipta .  She has been taking Qulipta  for nearly six months, which has effectively resolved her headaches. Initially, her insurance did not cover Qulipta , leading to a prescription for Elavil , which she never took. Eventually, she was able to obtain Qulipta  without taking Elavil . She finds Qulipta  effective, although she does not take it daily, using it approximately every one and a half to two weeks.  No symptoms of blood pressure issues, chest pain, shortness of breath, dizziness, or lightheadedness. She remains active, primarily walking, and sometimes running. She typically walks three miles, although occasionally only two miles, and reports good endurance without pain or shortness of breath.  Her weight remains stable or slightly increases. She has not had a Pap smear in approximately three years and plans to follow up with her gynecologist in the next couple of years.       Outpatient Medications Prior to Visit  Medication Sig Dispense Refill   acetaminophen  (TYLENOL ) 500 MG tablet Take 1,000 mg by mouth every 6 (six) hours as needed for moderate pain.     rosuvastatin  (CRESTOR ) 40 MG tablet Take 1 tablet (40 mg total) by mouth daily. 90 tablet 3   VITAMIN D  PO Take 1 capsule by mouth daily.     losartan  (COZAAR ) 100 MG tablet Take 1 tablet (100 mg total) by mouth daily. 90 tablet 3   QULIPTA  60 MG TABS Take 1 tablet (60 mg total) by mouth daily. 90 tablet 1   amitriptyline  (ELAVIL ) 10 MG tablet Take 1 tablet (10 mg  total) by mouth at bedtime. 90 tablet 0   No facility-administered medications prior to visit.    ROS Review of Systems  Genitourinary: Negative.   Musculoskeletal:  Positive for arthralgias. Negative for myalgias.  Neurological: Negative.  Negative for dizziness, weakness and light-headedness.  Hematological:  Negative for adenopathy. Does not bruise/bleed easily.  Psychiatric/Behavioral: Negative.      Objective:  BP 132/84 (BP Location: Left Arm, Patient Position: Sitting, Cuff Size: Normal)   Pulse 60   Temp 98.2 F (36.8 C) (Oral)   Resp 16   Ht 5\' 2"  (1.575 m)   Wt 135 lb 3.2 oz (61.3 kg)   LMP 01/08/2014   SpO2 98%   BMI 24.73 kg/m   BP Readings from Last 3 Encounters:  04/19/24 132/84  01/28/24 120/80  10/20/23 (!) 144/88    Wt Readings from Last 3 Encounters:  04/19/24 135 lb 3.2 oz (61.3 kg)  01/28/24 138 lb 3.2 oz (62.7 kg)  10/20/23 135 lb 6.4 oz (61.4 kg)    Physical Exam  Lab Results  Component Value Date   WBC 7.5 10/20/2023   HGB 13.2 10/20/2023   HCT 38.9 10/20/2023   PLT 241.0 10/20/2023   GLUCOSE 87 01/28/2024   CHOL 123 01/28/2024   TRIG 62 01/28/2024   HDL 63 01/28/2024   LDLCALC 47 01/28/2024   ALT 30 01/28/2024   AST 24 01/28/2024   NA 143 01/28/2024   K 4.5 01/28/2024  CL 105 01/28/2024   CREATININE 0.76 01/28/2024   BUN 11 01/28/2024   CO2 25 01/28/2024   TSH 1.73 02/10/2023   INR 0.9 01/29/2022    DG Bone Density Result Date: 01/13/2024 EXAM: DUAL X-RAY ABSORPTIOMETRY (DXA) FOR BONE MINERAL DENSITY IMPRESSION: Referring Physician:  Arcadio Knuckles Your patient completed a bone mineral density test using GE Lunar iDXA system (analysis version: 16). Technologist: BEC PATIENT: Name: Sherelyn, Borgelt Patient ID: [id number] Birth Date: 1961/06/30 Height: 61.5 in. Sex: Female Measured: 01/07/2024 Weight: 135.4 lbs. Indications: Caucasian, Estrogen Deficient, Postmenopausal, Vitamin D  Deficient Fractures: NONE Treatments: Vitamin D   (E933.5) ASSESSMENT: The BMD measured at Femur Neck Right is 0.949 g/cm2 with a T-score of -0.6. This patient is considered normal according to World Health Organization [facility]) criteria. The lumbar spine was excluded due to degenerative changes. The quality of the exam is good. Site Region Measured Date Measured Age YA BMD Significant CHANGE T-score DualFemur Neck Right 01/07/2024 61.9 -0.6 0.949 g/cm2 DualFemur Total Mean 01/07/2024 61.9 0.2 1.033 g/cm2 Left Forearm Radius 33% 01/07/2024 61.9 0.2 0.895 g/cm2 World Health Organization [facility]) criteria for post-menopausal, Caucasian Women: Normal       T-score at or above -1 SD Osteopenia   T-score between -1 and -2.5 SD Osteoporosis T-score at or below -2.5 SD RECOMMENDATION: 1. All patients should optimize calcium  and vitamin D  intake. 2. Consider FDA-approved medical therapies in postmenopausal women and men aged 70 years and older, based on the following: a. A hip or vertebral (clinical or morphometric) fracture. b. T-score = -2.5 at the femoral neck or spine after appropriate evaluation to exclude secondary causes. c. Low bone mass (T-score between -1.0 and -2.5 at the femoral neck or spine) and a 10-year probability of a hip fracture = 3% or a 10-year probability of a major osteoporosis-related fracture = 20% based on the [place] -adapted WHO algorithm. d. Clinician judgment and/or patient preferences may indicate treatment for people with 10-year fracture probabilities above or below these levels. FOLLOW-UP: Patients with diagnosis of osteoporosis or at high risk for fracture should have regular bone mineral density tests.? Patients eligible for Medicare are allowed routine testing every 2 years.? The testing frequency can be increased to one year for patients who have rapidly progressing disease, are receiving or discontinuing medical therapy to restore bone mass, or have additional risk factors. I have reviewed this study and agree with the findings. [facility] Electronically Signed   By: Tita Form M.D.   On: 01/13/2024 08:55   MM 3D SCREENING MAMMOGRAM BILATERAL BREAST Result Date: 01/11/2024 CLINICAL DATA:  Screening. EXAM: DIGITAL SCREENING BILATERAL MAMMOGRAM WITH TOMOSYNTHESIS AND CAD TECHNIQUE: Bilateral screening digital craniocaudal and mediolateral oblique mammograms were obtained. Bilateral screening digital breast tomosynthesis was performed. The images were evaluated with computer-aided detection. COMPARISON:  Previous exam(s). ACR Breast Density Category c: The breasts are heterogeneously dense, which may obscure small masses. FINDINGS: There are no findings suspicious for malignancy. IMPRESSION: No mammographic evidence of malignancy. A result letter of this screening mammogram will be mailed directly to the patient. RECOMMENDATION: Screening mammogram in one year. (Code:SM-B-01Y) BI-RADS CATEGORY  1: Negative. Electronically Signed   By: Alinda Apley M.D.   On: 01/11/2024 08:21    Assessment & Plan:  Cervical cancer screening -     Ambulatory referral to Gynecology  Hypertension, unspecified type -     Losartan  Potassium; Take 1 tablet (100 mg total) by mouth daily.  Dispense: 90 tablet; Refill: 1  Intractable chronic migraine with aura and without status migrainosus -     Qulipta ; Take 1 tablet (60 mg total) by mouth daily.  Dispense: 90 tablet; Refill: 1  Encounter for general adult medical examination with abnormal findings     Follow-up: Return in about 6 months (around 10/20/2024).  Sandra Crouch, MD

## 2024-06-02 ENCOUNTER — Encounter (HOSPITAL_COMMUNITY): Payer: Self-pay | Admitting: Interventional Radiology

## 2024-06-14 ENCOUNTER — Encounter: Payer: Self-pay | Admitting: Nurse Practitioner

## 2024-06-14 ENCOUNTER — Other Ambulatory Visit (HOSPITAL_COMMUNITY)
Admission: RE | Admit: 2024-06-14 | Discharge: 2024-06-14 | Disposition: A | Discharge status: Home / Self Care | Source: Ambulatory Visit | Attending: Nurse Practitioner | Admitting: Nurse Practitioner

## 2024-06-14 ENCOUNTER — Ambulatory Visit (INDEPENDENT_AMBULATORY_CARE_PROVIDER_SITE_OTHER): Admitting: Nurse Practitioner

## 2024-06-14 VITALS — BP 128/78 | HR 62 | Ht 61.25 in | Wt 132.0 lb

## 2024-06-14 DIAGNOSIS — Z124 Encounter for screening for malignant neoplasm of cervix: Secondary | ICD-10-CM | POA: Diagnosis not present

## 2024-06-14 DIAGNOSIS — N8110 Cystocele, unspecified: Secondary | ICD-10-CM | POA: Diagnosis not present

## 2024-06-14 DIAGNOSIS — Z01419 Encounter for gynecological examination (general) (routine) without abnormal findings: Secondary | ICD-10-CM

## 2024-06-14 DIAGNOSIS — Z1331 Encounter for screening for depression: Secondary | ICD-10-CM | POA: Diagnosis not present

## 2024-06-14 DIAGNOSIS — Z780 Asymptomatic menopausal state: Secondary | ICD-10-CM

## 2024-06-14 NOTE — Progress Notes (Signed)
 Melanie Howard [birth year] [id number]   History:  63 y.o. G3P3 presents as newly established patient. Last seen in our office in 2021. Postmenopausal - no HRT, no bleeding. Cryosurgery when she was in college. H/O cystocele, has seen urology for this in the past, mild symptoms, hesitant to do surgery. HLD, carotid stenosis managed by cardiology. Migraines with aura managed by PCP.   Gynecologic History Patient's last menstrual period was 01/08/2014.   Contraception/Family planning: post menopausal status Sexually active: Yes  Health Maintenance Last Pap: 01/21/2018. Results were: Normal neg HPV Last mammogram: 01/07/2024. Results were: Normal Last colonoscopy: 04/09/2021. Results were: benign polyp, 10-year recall Last Dexa: 01/07/2024. Results were: Normal     06/14/2024    2:19 PM  Depression screen PHQ 2/9  Decreased Interest 0  Down, Depressed, Hopeless 0  PHQ - 2 Score 0     Past medical history, past surgical history, family history and social history were all reviewed and documented in the EPIC chart. 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   ROS:  A ROS was performed and pertinent positives and negatives are included.  Exam:  Vitals:   06/14/24 1420  BP: 128/78  Pulse: 62  SpO2: 99%  Weight: 132 lb (59.9 kg)  Height: 5' 1.25 (1.556 m)   Body mass index is 24.74 kg/m.  General appearance:  Normal Thyroid :  Symmetrical, normal in size, without palpable masses or nodularity. Respiratory  Auscultation:  Clear without wheezing or rhonchi Cardiovascular  Auscultation:  Regular rate, without rubs, murmurs or gallops  Edema/varicosities:  Not grossly evident Abdominal  Soft,nontender, without masses, guarding or rebound.  Liver/spleen:  No organomegaly noted  Hernia:  None appreciated  Skin  Inspection:  Grossly normal Breasts: Examined lying and sitting.   Right: Without masses, retractions, nipple discharge or axillary adenopathy.   Left: Without  masses, retractions, nipple discharge or axillary adenopathy. Pelvic: External genitalia:  no lesions              Urethra:  normal appearing urethra with no masses, tenderness or lesions              Bartholins and Skenes: normal                 Vagina: normal appearing vagina with normal color and discharge, no lesions. Grade 3 cystocele              Cervix: no lesions Bimanual Exam:  Uterus:  no masses or tenderness              Adnexa: no mass, fullness, tenderness              Rectovaginal: Deferred              Anus:  normal, no lesions  Melanie Howard, CMA present as chaperone.   Assessment/Plan:  63 y.o. G3P3 for annual exam.   Well female exam with routine gynecological exam - Education provided on SBEs, importance of preventative screenings, current guidelines, high calcium  diet, regular exercise, and multivitamin daily. Labs with PCP.   Postmenopausal - no HRT, no bleeding  Cervical cancer screening - Plan: Cytology - PAP( [facility]) - cryosurgery in college, normal paps since.   Baden-Walker grade 3 cystocele - mild symptoms. Has seen urology. Hesitant to do surgery.   Screening for breast cancer - Normal mammogram history.  Continue annual screenings.  Normal breast exam today.  Screening for colon cancer - 2022 colonoscopy. Will repeat  at 10-year interval per GI's recommendation.   Screening for osteoporosis - Normal bone density January 2025.  Return in about 1 year (around 06/14/2025) for Annual.    Melanie DELENA Shutter DNP, 2:42 PM 06/14/2024

## 2024-06-21 LAB — CYTOLOGY - PAP
Comment: NEGATIVE
Diagnosis: UNDETERMINED — AB
High risk HPV: NEGATIVE

## 2024-06-22 ENCOUNTER — Ambulatory Visit: Payer: Self-pay | Admitting: Nurse Practitioner

## 2024-06-22 ENCOUNTER — Other Ambulatory Visit: Payer: Self-pay | Admitting: Nurse Practitioner

## 2024-06-22 DIAGNOSIS — N8110 Cystocele, unspecified: Secondary | ICD-10-CM

## 2024-06-30 ENCOUNTER — Telehealth: Payer: Self-pay

## 2024-06-30 ENCOUNTER — Other Ambulatory Visit (HOSPITAL_COMMUNITY): Payer: Self-pay

## 2024-06-30 NOTE — Telephone Encounter (Signed)
 Pharmacy Patient Advocate Encounter   Received notification from CoverMyMeds that prior authorization for Qulipta  60 is required/requested.   Insurance verification completed.   The patient is insured through [facility] [employer] .   Per test claim: The current 90 day co-pay is, $0.00.  No PA needed at this time. This test claim was processed through [facility]- copay amounts may vary at other pharmacies due to pharmacy/plan contracts, or as the patient moves through the different stages of their insurance plan.

## 2024-06-30 NOTE — Telephone Encounter (Signed)
 Patient has been made aware.

## 2024-07-07 ENCOUNTER — Ambulatory Visit: Admitting: Obstetrics

## 2024-08-22 ENCOUNTER — Ambulatory Visit: Admitting: Obstetrics

## 2024-08-25 ENCOUNTER — Ambulatory Visit: Admitting: Obstetrics

## 2024-09-01 ENCOUNTER — Other Ambulatory Visit (HOSPITAL_COMMUNITY): Payer: Self-pay

## 2024-09-01 ENCOUNTER — Encounter: Payer: Self-pay | Admitting: Obstetrics and Gynecology

## 2024-09-01 ENCOUNTER — Ambulatory Visit: Admitting: Obstetrics and Gynecology

## 2024-09-01 VITALS — BP 92/68 | HR 66 | Ht 61.5 in | Wt 130.4 lb

## 2024-09-01 DIAGNOSIS — N952 Postmenopausal atrophic vaginitis: Secondary | ICD-10-CM | POA: Diagnosis not present

## 2024-09-01 DIAGNOSIS — N814 Uterovaginal prolapse, unspecified: Secondary | ICD-10-CM

## 2024-09-01 DIAGNOSIS — R151 Fecal smearing: Secondary | ICD-10-CM | POA: Diagnosis not present

## 2024-09-01 DIAGNOSIS — R350 Frequency of micturition: Secondary | ICD-10-CM | POA: Diagnosis not present

## 2024-09-01 DIAGNOSIS — N816 Rectocele: Secondary | ICD-10-CM

## 2024-09-01 DIAGNOSIS — N393 Stress incontinence (female) (male): Secondary | ICD-10-CM | POA: Diagnosis not present

## 2024-09-01 DIAGNOSIS — N8110 Cystocele, unspecified: Secondary | ICD-10-CM

## 2024-09-01 LAB — POCT URINALYSIS DIP (CLINITEK)
Bilirubin, UA: NEGATIVE
Glucose, UA: NEGATIVE mg/dL
Ketones, POC UA: NEGATIVE mg/dL
Leukocytes, UA: NEGATIVE
Nitrite, UA: NEGATIVE
POC PROTEIN,UA: NEGATIVE
Spec Grav, UA: 1.01 (ref 1.010–1.025)
Urobilinogen, UA: 0.2 U/dL
pH, UA: 6.5 (ref 5.0–8.0)

## 2024-09-01 MED ORDER — ESTRADIOL 0.1 MG/GM VA CREA
0.5000 g | TOPICAL_CREAM | VAGINAL | 11 refills | Status: DC
  Filled 2024-09-01: qty 42.5, 90d supply, fill #0

## 2024-09-01 NOTE — Progress Notes (Signed)
 [facility] New Patient Evaluation and Consultation  Referring Provider: Prentiss Annabella LABOR, NP PCP: Joshua Debby CROME, MD Date of Service: 09/01/2024  SUBJECTIVE Chief Complaint: New Patient (Initial Visit) Melanie Howard is a 63 y.o. female is here for cystocele.)  History of Present Illness: Melanie Howard is a 63 y.o. White or Caucasian female seen in consultation at the request of Dr. Prentiss for evaluation of prolapse.    Review of records significant for: Saw urology 10 years ago and did not pursue surgery at that time. Reports she had bladder tests done.   Urinary Symptoms: Leaks urine with cough/ sneeze, exercise, with a full bladder, with movement to the bathroom, and with urgency Leaks 4 time(s) per days.  Pad use: 2 liners/ mini-pads per day.   Patient is bothered by UI symptoms.  Day time voids >10.  Nocturia: 1-2 times per night to void. Voiding dysfunction:  empties bladder well.  Patient does not use a catheter to empty bladder.  When urinating, patient feels dribbling after finishing and the need to urinate multiple times in a row Drinks: 32-60oz water  approximately, 8oz [place] dew per day  UTIs: 0 UTI's in the last year.   Denies history of urologic concerns No results found for the last 90 days.   Pelvic Organ Prolapse Symptoms:                  Patient Admits to a feeling of a bulge the vaginal area. It has been present for 10 years.  Patient Admits to seeing a bulge.  This bulge is bothersome.  Bowel Symptom: Bowel movements: 1 time(s) per day Stool consistency: soft  Straining: no.  Splinting: no.  Incomplete evacuation: yes.  Patient Admits to accidental bowel leakage / fecal incontinence  Occurs: Occasionally  Consistency with leakage: liquid Bowel regimen: diet and fiber Last colonoscopy: Date 2022, Results polyp, otherwise normal [facility] Colonoscopy          Upcoming     Colonoscopy (Every 10 Years) Next due on 04/10/2031     04/09/2021  COLONOSCOPY   Only the first 1 history entries have been loaded, but more history exists.                Sexual Function Sexually active: yes.  Sexual orientation: Straight Pain with sex: No  Pelvic Pain Denies pelvic pain    Past Medical History:  Past Medical History:  Diagnosis Date   Abnormal Pap smear of cervix    In college   Carotid artery disease    COVID    History of kidney stones    Hyperlipidemia    Hypertension    Migraines      Past Surgical History:   Past Surgical History:  Procedure Laterality Date   COLONOSCOPY WITH PROPOFOL  N/A 04/09/2021   Procedure: COLONOSCOPY WITH PROPOFOL ;  Surgeon: Eartha Angelia Sieving, MD;  Location: [facility];  Service: Gastroenterology;  Laterality: N/A;  8:45   COLPOSCOPY     GYNECOLOGIC CRYOSURGERY  college   paps normal since   IR ANGIO INTRA EXTRACRAN SEL COM CAROTID INNOMINATE BILAT MOD SED  01/29/2022   IR ANGIO VERTEBRAL SEL VERTEBRAL BILAT MOD SED  01/29/2022   IR RADIOLOGIST EVAL & MGMT  01/15/2022   IR US  GUIDE VASC ACCESS RIGHT  01/29/2022   POLYPECTOMY  04/09/2021   Procedure: POLYPECTOMY INTESTINAL;  Surgeon: Eartha Angelia Sieving, MD;  Location: [facility];  Service: Gastroenterology;;   TUBAL LIGATION  Past OB/GYN History: [id number] Vaginal deliveries: 3,  Forceps/ Vacuum deliveries: 1, Cesarean section: 0 Menopausal: Yes, at age 9's Contraception: none. Last pap smear was 2025.  Any history of abnormal pap smears: yes. [facility] PAP   This patient has no relevant Health Maintenance data.     Medications: Patient has a current medication list which includes the following prescription(s): acetaminophen , [START ON 09/04/2024] estradiol , losartan , qulipta , rosuvastatin , and vitamin d .   Allergies: Patient is allergic to amlodipine .   Social History:  Social History   Tobacco Use   Smoking status: Never   Smokeless tobacco: Never  Vaping Use   Vaping status: Never  Used  Substance Use Topics   Alcohol use: Yes    Comment: Rare; socially on weekends   Drug use: No    Relationship status: married Patient lives with husband.   Patient is employed as an Charity fundraiser. Regular exercise: Yes: walking History of abuse: No  Family History:   Family History  Problem Relation Age of Onset   Hypertension Mother    Hypertension Father    Heart disease Father    Depression Sister    Restless legs syndrome Sister    Breast cancer Maternal Aunt 22   Breast cancer Maternal Aunt 50   Hypertension Brother      Review of Systems: Review of Systems  Constitutional:  Negative for chills and fever.  Respiratory:  Negative for cough and shortness of breath.   Cardiovascular:  Negative for chest pain and palpitations.  Gastrointestinal:  Negative for abdominal pain, blood in stool, constipation and diarrhea.  Skin:  Negative for rash.  Neurological:  Positive for dizziness and headaches. Negative for weakness.  Psychiatric/Behavioral:  Negative for depression and suicidal ideas.      OBJECTIVE Physical Exam: Vitals:   09/01/24 0954  BP: 92/68  Pulse: 66  Weight: 130 lb 6.4 oz (59.1 kg)  Height: 5' 1.5 (1.562 m)    Physical Exam Vitals reviewed. Exam conducted with a chaperone present.  Constitutional:      Appearance: Normal appearance.  Pulmonary:     Effort: Pulmonary effort is normal.  Abdominal:     Palpations: Abdomen is soft.  Neurological:     General: No focal deficit present.     Mental Status: She is alert and oriented to person, place, and time.  Psychiatric:        Mood and Affect: Mood normal.        Behavior: Behavior normal. Behavior is cooperative.        Thought Content: Thought content normal.      GU / Detailed Urogynecologic Evaluation:  Pelvic Exam: Normal external female genitalia; Bartholin's and Skene's glands normal in appearance; urethral meatus normal in appearance, no urethral masses or discharge.   CST:  negative  Speculum exam reveals normal vaginal mucosa with atrophy. Cervix normal appearance. Uterus normal single, nontender. Adnexa normal adnexa.    With apex supported, anterior compartment defect was present  Pelvic floor strength III/V  Pelvic floor musculature: Right levator non-tender, Right obturator non-tender, Left levator tender, Left obturator tender  POP-Q:   POP-Q  1                                            Aa   1  Ba  -5                                              C   6                                            Gh  3                                            Pb  8.5                                            tvl   -2                                            Ap  -2                                            Bp  -6                                              D      Rectal Exam:  Normal external exam  Post-Void Residual (PVR) by Bladder Scan: In order to evaluate bladder emptying, we discussed obtaining a postvoid residual and patient agreed to this procedure.  Procedure: The ultrasound unit was placed on the patient's abdomen in the suprapubic region after the patient had voided.    Post Void Residual - 09/01/24 1051       Post Void Residual   Post Void Residual 34 mL           Laboratory Results: Lab Results  Component Value Date   COLORU yellow 09/01/2024   CLARITYU clear 09/01/2024   GLUCOSEUR negative 09/01/2024   BILIRUBINUR negative 09/01/2024   SPECGRAV 1.010 09/01/2024   RBCUR trace-lysed (A) 09/01/2024   PHUR 6.5 09/01/2024   PROTEINUR 30 (A) 05/26/2019   UROBILINOGEN 0.2 09/01/2024   LEUKOCYTESUR Negative 09/01/2024    Lab Results  Component Value Date   CREATININE 0.76 01/28/2024   CREATININE 0.74 10/20/2023   CREATININE 0.70 10/06/2023    No results found for: HGBA1C  Lab Results  Component Value Date   HGB 13.2 10/20/2023     ASSESSMENT AND PLAN Ms.  Achey is a 63 y.o. with:  1. Prolapse of anterior vaginal wall   2. Posterior vaginal wall prolapse   3. Uterine prolapse   4. Vaginal atrophy   5. Fecal smearing   6. Urinary frequency   7. SUI (stress urinary incontinence, female)    Patient has stage II-III/IV anterior vaginal prolapse. She is symptomatic of prolapse but still empties her  bladder well. 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 She does wish to remain sexually active. We dicussed options of pessary which she can take in and out, surgically we discussed options of Sacrocolpopexy and sacrospinous fixation.  We discussed her pelvic floor defects using POP-Q and IUGA diagrams. Patient reported understanding. She is unsure which direction for prolapse management she would like to proceed. Will await patient to let me know her thoughts.  Handouts given on pessaries, Sacrocolpopexy, and sacrospinous ligament fixation.  Patient has vaginal atrophy on exam. She would benefit from estrogen cream. Patient to use a blueberry sized amount into the vagina. She may use this nightly for 2 weeks and then twice weekly after. We discussed using her finger instead of using the applicator.  We discussed implementing a squatty potty and using benefiber or metamucil to bulk her stool so she has more control of the stool.  We discussed the symptoms of overactive bladder (OAB), which include urinary urgency, urinary frequency, nocturia, with or without urge incontinence.  While we do not know the exact etiology of OAB, several treatment options exist. We discussed management including behavioral therapy (decreasing bladder irritants, urge suppression strategies, timed voids, bladder retraining), and pumpkin seed extract. She is not interested in medications at this time. Did not further delve into OAB management beyond the holistic options at this time.  We discussed that for her SUI, this is something  that can be addressed with both a pessary and surgical intervention as well. We discussed that supporting her prolapse without addressing this in either fashion could make her leakage worse. She reports understanding.   Patient to call or send MyChart message if she would like to move forward with surgical discussion/planning/urodynamics or if she would like to try a pessary.    Hiawatha Merriott G Mauria Asquith, NP

## 2024-09-01 NOTE — Patient Instructions (Addendum)
 Consider your options for repair or pessary for the prolapse  You have a stage 2-3 Anterior vaginal prolapse, Stage 1 Uterine prolapse and stage 1 Posterior vaginal wall prolapse   Today we talked about ways to manage bladder urgency such as altering your diet to avoid irritative beverages and foods (bladder diet) as well as attempting to decrease stress and other exacerbating factors.   There is a website with helpful information for people with bladder irritation, called the IC Network at https://www.ic-network.com. This website has more information about a healthy bladder diet and patient forums for support.  The Most Bothersome Foods* The Least Bothersome Foods*  Coffee - Regular & Decaf Tea - caffeinated Carbonated beverages - cola, non-colas, diet & caffeine-free Alcohols - Beer, Red Wine, White Wine, Champagne Fruits - Grapefruit, [place], Orange, [employer] - Cranberry, Grapefruit, Orange, Pineapple Vegetables - Tomato & Tomato Products Flavor Enhancers - Hot peppers, Spicy foods, Chili, Horseradish, Vinegar, Monosodium glutamate (MSG) Artificial Sweeteners - NutraSweet, Sweet 'N Low, Equal (sweetener), Saccharin Ethnic foods - [place], [place], [place] food Water  Milk - low-fat & whole Fruits - Bananas, Blueberries, Honeydew melon, Pears, Raisins, Watermelon Vegetables - Broccoli, [address] Sprouts, [place], [place], Cauliflower, [place], Cucumber, Mushrooms, Peas, Radishes, Squash, Zucchini, White potatoes, Sweet potatoes & yams Poultry - Chicken, Eggs, [place], [employer] - Beef, Diplomatic Services operational officer, Lamb Seafood - Shrimp, [place] fish, Salmon Grains - Oat, Rice Snacks - Pretzels, Popcorn  *Mitch ALF et al. Diet and its role in interstitial cystitis/bladder pain syndrome (IC/BPS) and comorbid conditions. [employer]. BJU Int. 2012 Jan 11.     Consider adding in pumpkin seed extract up to 5gms daily can be helpful for your bladder.   Please let me know if you have any questions.

## 2024-09-12 ENCOUNTER — Other Ambulatory Visit (HOSPITAL_COMMUNITY): Payer: Self-pay

## 2024-10-23 ENCOUNTER — Ambulatory Visit: Admitting: Internal Medicine

## 2024-11-08 ENCOUNTER — Encounter: Payer: Self-pay | Admitting: Internal Medicine

## 2024-11-08 ENCOUNTER — Ambulatory Visit: Admitting: Internal Medicine

## 2024-11-08 VITALS — BP 138/82 | HR 65 | Temp 98.1°F | Resp 16 | Ht 61.5 in | Wt 129.6 lb

## 2024-11-08 DIAGNOSIS — E785 Hyperlipidemia, unspecified: Secondary | ICD-10-CM

## 2024-11-08 DIAGNOSIS — I10 Essential (primary) hypertension: Secondary | ICD-10-CM

## 2024-11-08 LAB — BASIC METABOLIC PANEL WITH GFR
BUN: 15 mg/dL (ref 6–23)
CO2: 29 meq/L (ref 19–32)
Calcium: 9.7 mg/dL (ref 8.4–10.5)
Chloride: 103 meq/L (ref 96–112)
Creatinine, Ser: 0.76 mg/dL (ref 0.40–1.20)
GFR: 83.75 mL/min (ref >60.00)
Glucose, Bld: 96 mg/dL (ref 70–99)
Potassium: 3.7 meq/L (ref 3.5–5.1)
Sodium: 140 meq/L (ref 135–145)

## 2024-11-08 LAB — LIPID PANEL
Cholesterol: 120 mg/dL (ref 0–200)
HDL: 53.8 mg/dL (ref >39.00)
LDL Cholesterol: 45 mg/dL (ref 0–99)
NonHDL: 65.91
Total CHOL/HDL Ratio: 2
Triglycerides: 103 mg/dL (ref 0.0–149.0)
VLDL: 20.6 mg/dL (ref 0.0–40.0)

## 2024-11-08 LAB — CBC WITH DIFFERENTIAL/PLATELET
Basophils Absolute: 0 K/uL (ref 0.0–0.1)
Basophils Relative: 0.6 % (ref 0.0–3.0)
Eosinophils Absolute: 0.1 K/uL (ref 0.0–0.7)
Eosinophils Relative: 1.5 % (ref 0.0–5.0)
HCT: 40.5 % (ref 36.0–46.0)
Hemoglobin: 14 g/dL (ref 12.0–15.0)
Lymphocytes Relative: 33.5 % (ref 12.0–46.0)
Lymphs Abs: 2.4 K/uL (ref 0.7–4.0)
MCHC: 34.6 g/dL (ref 30.0–36.0)
MCV: 89.7 fl (ref 78.0–100.0)
Monocytes Absolute: 0.6 K/uL (ref 0.1–1.0)
Monocytes Relative: 8.9 % (ref 3.0–12.0)
Neutro Abs: 3.9 K/uL (ref 1.4–7.7)
Neutrophils Relative %: 55.5 % (ref 43.0–77.0)
Platelets: 208 K/uL (ref 150.0–400.0)
RBC: 4.51 Mil/uL (ref 3.87–5.11)
RDW: 12.9 % (ref 11.5–15.5)
WBC: 7.1 K/uL (ref 4.0–10.5)

## 2024-11-08 LAB — HEPATIC FUNCTION PANEL
ALT: 25 U/L (ref 0–35)
AST: 24 U/L (ref 0–37)
Albumin: 4.8 g/dL (ref 3.5–5.2)
Alkaline Phosphatase: 87 U/L (ref 39–117)
Bilirubin, Direct: 0.1 mg/dL (ref 0.0–0.3)
Total Bilirubin: 0.5 mg/dL (ref 0.2–1.2)
Total Protein: 7.2 g/dL (ref 6.0–8.3)

## 2024-11-08 LAB — TSH: TSH: 1.71 u[IU]/mL (ref 0.35–5.50)

## 2024-11-08 NOTE — Patient Instructions (Signed)
 Hypertension, Adult High blood pressure (hypertension) is when the force of blood pumping through the arteries is too strong. The arteries are the blood vessels that carry blood from the heart throughout the body. Hypertension forces the heart to work harder to pump blood and may cause arteries to become narrow or stiff. Untreated or uncontrolled hypertension can lead to a heart attack, heart failure, a stroke, kidney disease, and other problems. A blood pressure reading consists of a higher number over a lower number. Ideally, your blood pressure should be below 120/80. The first ("top") number is called the systolic pressure. It is a measure of the pressure in your arteries as your heart beats. The second ("bottom") number is called the diastolic pressure. It is a measure of the pressure in your arteries as the heart relaxes. What are the causes? The exact cause of this condition is not known. There are some conditions that result in high blood pressure. What increases the risk? Certain factors may make you more likely to develop high blood pressure. Some of these risk factors are under your control, including: Smoking. Not getting enough exercise or physical activity. Being overweight. Having too much fat, sugar, calories, or salt (sodium) in your diet. Drinking too much alcohol. Other risk factors include: Having a personal history of heart disease, diabetes, high cholesterol, or kidney disease. Stress. Having a family history of high blood pressure and high cholesterol. Having obstructive sleep apnea. Age. The risk increases with age. What are the signs or symptoms? High blood pressure may not cause symptoms. Very high blood pressure (hypertensive crisis) may cause: Headache. Fast or irregular heartbeats (palpitations). Shortness of breath. Nosebleed. Nausea and vomiting. Vision changes. Severe chest pain, dizziness, and seizures. How is this diagnosed? This condition is diagnosed by  measuring your blood pressure while you are seated, with your arm resting on a flat surface, your legs uncrossed, and your feet flat on the floor. The cuff of the blood pressure monitor will be placed directly against the skin of your upper arm at the level of your heart. Blood pressure should be measured at least twice using the same arm. Certain conditions can cause a difference in blood pressure between your right and left arms. If you have a high blood pressure reading during one visit or you have normal blood pressure with other risk factors, you may be asked to: Return on a different day to have your blood pressure checked again. Monitor your blood pressure at home for 1 week or longer. If you are diagnosed with hypertension, you may have other blood or imaging tests to help your health care provider understand your overall risk for other conditions. How is this treated? This condition is treated by making healthy lifestyle changes, such as eating healthy foods, exercising more, and reducing your alcohol intake. You may be referred for counseling on a healthy diet and physical activity. Your health care provider may prescribe medicine if lifestyle changes are not enough to get your blood pressure under control and if: Your systolic blood pressure is above 130. Your diastolic blood pressure is above 80. Your personal target blood pressure may vary depending on your medical conditions, your age, and other factors. Follow these instructions at home: Eating and drinking  Eat a diet that is high in fiber and potassium, and low in sodium, added sugar, and fat. An example of this eating plan is called the DASH diet. DASH stands for Dietary Approaches to Stop Hypertension. To eat this way: Eat  plenty of fresh fruits and vegetables. Try to fill one half of your plate at each meal with fruits and vegetables. Eat whole grains, such as whole-wheat pasta, brown rice, or whole-grain bread. Fill about one  fourth of your plate with whole grains. Eat or drink low-fat dairy products, such as skim milk or low-fat yogurt. Avoid fatty cuts of meat, processed or cured meats, and poultry with skin. Fill about one fourth of your plate with lean proteins, such as fish, chicken without skin, beans, eggs, or tofu. Avoid pre-made and processed foods. These tend to be higher in sodium, added sugar, and fat. Reduce your daily sodium intake. Many people with hypertension should eat less than 1,500 mg of sodium a day. Do not drink alcohol if: Your health care provider tells you not to drink. You are pregnant, may be pregnant, or are planning to become pregnant. If you drink alcohol: Limit how much you have to: 0-1 drink a day for women. 0-2 drinks a day for men. Know how much alcohol is in your drink. In the [place] one drink equals one 12 oz bottle of beer (355 mL), one 5 oz glass of wine (148 mL), or one 1 oz glass of hard liquor (44 mL). Lifestyle  Work with your health care provider to maintain a healthy body weight or to lose weight. Ask what an ideal weight is for you. Get at least 30 minutes of exercise that causes your heart to beat faster (aerobic exercise) most days of the week. Activities may include walking, swimming, or biking. Include exercise to strengthen your muscles (resistance exercise), such as Pilates or lifting weights, as part of your weekly exercise routine. Try to do these types of exercises for 30 minutes at least 3 days a week. Do not use any products that contain nicotine or tobacco. These products include cigarettes, chewing tobacco, and vaping devices, such as e-cigarettes. If you need help quitting, ask your health care provider. Monitor your blood pressure at home as told by your health care provider. Keep all follow-up visits. This is important. Medicines Take over-the-counter and prescription medicines only as told by your health care provider. Follow directions carefully. Blood  pressure medicines must be taken as prescribed. Do not skip doses of blood pressure medicine. Doing this puts you at risk for problems and can make the medicine less effective. Ask your health care provider about side effects or reactions to medicines that you should watch for. Contact a health care provider if you: Think you are having a reaction to a medicine you are taking. Have headaches that keep coming back (recurring). Feel dizzy. Have swelling in your ankles. Have trouble with your vision. Get help right away if you: Develop a severe headache or confusion. Have unusual weakness or numbness. Feel faint. Have severe pain in your chest or abdomen. Vomit repeatedly. Have trouble breathing. These symptoms may be an emergency. Get help right away. Call 911. Do not wait to see if the symptoms will go away. Do not drive yourself to the hospital. Summary Hypertension is when the force of blood pumping through your arteries is too strong. If this condition is not controlled, it may put you at risk for serious complications. Your personal target blood pressure may vary depending on your medical conditions, your age, and other factors. For most people, a normal blood pressure is less than 120/80. Hypertension is treated with lifestyle changes, medicines, or a combination of both. Lifestyle changes include losing weight, eating a healthy,  low-sodium diet, exercising more, and limiting alcohol. This information is not intended to replace advice given to you by your health care provider. Make sure you discuss any questions you have with your health care provider. Document Revised: 09/30/2021 Document Reviewed: 09/30/2021 Elsevier Patient Education  2024 [employer].

## 2024-11-08 NOTE — Progress Notes (Unsigned)
 Subjective:  Patient ID: Melanie Howard, female    DOB: [birth year]  Age: 63 y.o. MRN: [id number]  CC: Hypertension and Hyperlipidemia   HPI Melanie Howard presents for f/up --    Discussed the use of AI scribe software for clinical note transcription with the patient, who gave verbal consent to proceed.  History of Present Illness Melanie Howard is a 63 year old female with hypertension who presents for blood pressure management.  She experiences occasional lightheadedness, which she manages by increasing fluid intake. No palpitations or significant symptoms related to her heart rate, which she monitors at home, typically finding it in the fifties or sixties.  Her headaches are well controlled with Qulipta , taken intermittently for a couple of weeks at a time when symptoms arise. She does not take it daily and currently has a supply of the medication.  She was previously on amlodipine  but discontinued due to leg swelling and low blood pressure. Currently, she is on losartan , with home blood pressure readings generally in the 120s, occasionally reaching 140/85.  She maintains an active lifestyle, attempting to walk seven miles on non-working days. She experiences mild shortness of breath only when climbing four flights of stairs at work.  She had COVID-19 in August, resulting in a few days of bed rest. She has not received the COVID vaccine since then.  Her weight has fluctuated, having lost down to 126 pounds but has since regained it. No issues with appetite or bowel movements.     Outpatient Medications Prior to Visit  Medication Sig Dispense Refill   acetaminophen  (TYLENOL ) 500 MG tablet Take 1,000 mg by mouth every 6 (six) hours as needed for moderate pain.     QULIPTA  60 MG TABS Take 1 tablet (60 mg total) by mouth daily. 90 tablet 1   VITAMIN D  PO Take 1 capsule by mouth daily.     losartan  (COZAAR ) 100 MG tablet Take 1 tablet (100 mg total) by mouth daily. 90  tablet 1   rosuvastatin  (CRESTOR ) 40 MG tablet Take 1 tablet (40 mg total) by mouth daily. 90 tablet 3   estradiol  (ESTRACE ) 0.1 MG/GM vaginal cream Place 0.5 g vaginally 2 (two) times a week. Place 0.5g nightly for two weeks then twice a week after 42.5 g 11   No facility-administered medications prior to visit.    ROS Review of Systems  Constitutional:  Negative for appetite change, chills, diaphoresis, fatigue and fever.  HENT: Negative.    Eyes: Negative.  Negative for visual disturbance.  Respiratory:  Negative for cough, chest tightness and wheezing.   Cardiovascular:  Negative for chest pain, palpitations and leg swelling.  Gastrointestinal:  Negative for abdominal pain, constipation, diarrhea, nausea and vomiting.  Genitourinary: Negative.  Negative for difficulty urinating.  Musculoskeletal: Negative.   Skin: Negative.   Neurological:  Positive for light-headedness. Negative for dizziness, weakness and headaches.  Hematological:  Negative for adenopathy. Does not bruise/bleed easily.  Psychiatric/Behavioral: Negative.      Objective:  BP 138/82 (BP Location: Left Arm, Patient Position: Sitting)   Pulse 65   Temp 98.1 F (36.7 C) (Temporal)   Resp 16   Ht 5' 1.5 (1.562 m)   Wt 129 lb 9.6 oz (58.8 kg)   LMP 01/08/2014   SpO2 98%   BMI 24.09 kg/m   BP Readings from Last 3 Encounters:  11/08/24 138/82  09/01/24 92/68  06/14/24 128/78    Wt Readings from Last 3 Encounters:  11/08/24  129 lb 9.6 oz (58.8 kg)  09/01/24 130 lb 6.4 oz (59.1 kg)  06/14/24 132 lb (59.9 kg)    Physical Exam Vitals reviewed.  Constitutional:      Appearance: Normal appearance.  HENT:     Nose: Nose normal.     Mouth/Throat:     Mouth: Mucous membranes are moist.  Eyes:     General: No scleral icterus.    Conjunctiva/sclera: Conjunctivae normal.  Cardiovascular:     Rate and Rhythm: Normal rate and regular rhythm.     Heart sounds: No murmur heard.    No friction rub. No  gallop.  Pulmonary:     Effort: Pulmonary effort is normal.     Breath sounds: No stridor. No wheezing, rhonchi or rales.  Abdominal:     General: Abdomen is flat.     Palpations: There is no mass.     Tenderness: There is no abdominal tenderness. There is no guarding.     Hernia: No hernia is present.  Musculoskeletal:        General: Normal range of motion.     Cervical back: Neck supple.     Right lower leg: No edema.     Left lower leg: No edema.  Lymphadenopathy:     Cervical: No cervical adenopathy.  Skin:    General: Skin is warm and dry.  Neurological:     Mental Status: She is alert. Mental status is at baseline.  Psychiatric:        Mood and Affect: Mood normal.     Lab Results  Component Value Date   WBC 7.1 11/08/2024   HGB 14.0 11/08/2024   HCT 40.5 11/08/2024   PLT 208.0 11/08/2024   GLUCOSE 96 11/08/2024   CHOL 120 11/08/2024   TRIG 103.0 11/08/2024   HDL 53.80 11/08/2024   LDLCALC 45 11/08/2024   ALT 25 11/08/2024   AST 24 11/08/2024   NA 140 11/08/2024   K 3.7 11/08/2024   CL 103 11/08/2024   CREATININE 0.76 11/08/2024   BUN 15 11/08/2024   CO2 29 11/08/2024   TSH 1.71 11/08/2024   INR 0.9 01/29/2022    No results found.  Assessment & Plan:  Primary hypertension- BP is adequately well controlled. -     Basic metabolic panel with GFR; Future -     CBC with Differential/Platelet; Future -     Losartan  Potassium; Take 1 tablet (100 mg total) by mouth daily.  Dispense: 90 tablet; Refill: 1  Hyperlipidemia with target LDL less than 100- LDL goal achieved. Doing well on the statin  -     TSH; Future -     Hepatic function panel; Future -     Lipid panel; Future -     Rosuvastatin  Calcium ; Take 1 tablet (40 mg total) by mouth daily.  Dispense: 90 tablet; Refill: 1     Follow-up: Return in about 6 months (around 05/09/2025).  Debby Molt, MD

## 2024-11-09 ENCOUNTER — Ambulatory Visit: Payer: Self-pay | Admitting: Internal Medicine

## 2024-11-10 ENCOUNTER — Other Ambulatory Visit (HOSPITAL_COMMUNITY): Payer: Self-pay

## 2024-11-10 MED ORDER — LOSARTAN POTASSIUM 100 MG PO TABS
100.0000 mg | ORAL_TABLET | Freq: Every day | ORAL | 1 refills | Status: AC
  Filled 2024-11-10 – 2024-11-21 (×2): qty 90, 90d supply, fill #0

## 2024-11-10 MED ORDER — ROSUVASTATIN CALCIUM 40 MG PO TABS
40.0000 mg | ORAL_TABLET | Freq: Every day | ORAL | 1 refills | Status: AC
  Filled 2024-11-10 – 2024-11-21 (×2): qty 90, 90d supply, fill #0

## 2024-11-21 ENCOUNTER — Other Ambulatory Visit: Payer: Self-pay

## 2024-11-21 ENCOUNTER — Other Ambulatory Visit (HOSPITAL_COMMUNITY): Payer: Self-pay

## 2024-12-08 ENCOUNTER — Other Ambulatory Visit (HOSPITAL_COMMUNITY): Payer: Self-pay

## 2024-12-09 ENCOUNTER — Other Ambulatory Visit (HOSPITAL_COMMUNITY): Payer: Self-pay

## 2025-05-09 ENCOUNTER — Ambulatory Visit: Admitting: Internal Medicine

## 2025-06-20 ENCOUNTER — Ambulatory Visit: Admitting: Nurse Practitioner
